# Patient Record
Sex: Female | Born: 1985 | Race: Black or African American | Hispanic: No | Marital: Single | Smoking: Current every day smoker
Health system: Southern US, Community
[De-identification: ages and names within clinical notes are randomized; demographics above are authoritative.]

## PROBLEM LIST (undated history)

## (undated) DIAGNOSIS — A599 Trichomoniasis, unspecified: Secondary | ICD-10-CM

## (undated) DIAGNOSIS — G8929 Other chronic pain: Secondary | ICD-10-CM

## (undated) DIAGNOSIS — M419 Scoliosis, unspecified: Secondary | ICD-10-CM

## (undated) DIAGNOSIS — R0602 Shortness of breath: Secondary | ICD-10-CM

## (undated) DIAGNOSIS — M5442 Lumbago with sciatica, left side: Secondary | ICD-10-CM

## (undated) DIAGNOSIS — M5440 Lumbago with sciatica, unspecified side: Secondary | ICD-10-CM

## (undated) DIAGNOSIS — J45909 Unspecified asthma, uncomplicated: Secondary | ICD-10-CM

## (undated) DIAGNOSIS — E079 Disorder of thyroid, unspecified: Secondary | ICD-10-CM

## (undated) DIAGNOSIS — M542 Cervicalgia: Secondary | ICD-10-CM

## (undated) DIAGNOSIS — M5136 Other intervertebral disc degeneration, lumbar region: Secondary | ICD-10-CM

## (undated) DIAGNOSIS — M549 Dorsalgia, unspecified: Secondary | ICD-10-CM

## (undated) DIAGNOSIS — M51369 Other intervertebral disc degeneration, lumbar region without mention of lumbar back pain or lower extremity pain: Secondary | ICD-10-CM

## (undated) DIAGNOSIS — N83209 Unspecified ovarian cyst, unspecified side: Secondary | ICD-10-CM

## (undated) DIAGNOSIS — A549 Gonococcal infection, unspecified: Secondary | ICD-10-CM

## (undated) DIAGNOSIS — Z789 Other specified health status: Secondary | ICD-10-CM

## (undated) HISTORY — DX: Lumbago with sciatica, unspecified side: M54.40

## (undated) HISTORY — DX: Scoliosis, unspecified: M41.9

## (undated) HISTORY — PX: NO PAST SURGERIES: SHX2092

## (undated) HISTORY — DX: Dorsalgia, unspecified: M54.9

## (undated) HISTORY — DX: Other intervertebral disc degeneration, lumbar region without mention of lumbar back pain or lower extremity pain: M51.369

## (undated) HISTORY — DX: Disorder of thyroid, unspecified: E07.9

## (undated) HISTORY — DX: Lumbago with sciatica, left side: M54.42

## (undated) HISTORY — DX: Other chronic pain: G89.29

## (undated) HISTORY — DX: Unspecified asthma, uncomplicated: J45.909

## (undated) HISTORY — DX: Cervicalgia: M54.2

## (undated) HISTORY — DX: Other intervertebral disc degeneration, lumbar region: M51.36

---
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
## 2007-07-07 ENCOUNTER — Emergency Department (HOSPITAL_COMMUNITY): Admission: EM | Admit: 2007-07-07 | Discharge: 2007-07-07 | Payer: Self-pay | Admitting: Emergency Medicine

## 2009-10-15 ENCOUNTER — Emergency Department (HOSPITAL_COMMUNITY): Admission: EM | Admit: 2009-10-15 | Discharge: 2009-10-16 | Payer: Self-pay | Admitting: Emergency Medicine

## 2009-10-15 ENCOUNTER — Ambulatory Visit: Payer: Self-pay | Admitting: Psychiatry

## 2009-10-16 ENCOUNTER — Inpatient Hospital Stay (HOSPITAL_COMMUNITY): Admission: EM | Admit: 2009-10-16 | Discharge: 2009-10-23 | Payer: Self-pay | Admitting: Psychiatry

## 2009-12-01 ENCOUNTER — Encounter: Payer: Self-pay | Admitting: Obstetrics & Gynecology

## 2009-12-01 ENCOUNTER — Ambulatory Visit: Payer: Self-pay | Admitting: Obstetrics & Gynecology

## 2009-12-01 LAB — CONVERTED CEMR LAB
Basophils Absolute: 0 10*3/uL (ref 0.0–0.1)
Basophils Relative: 0 % (ref 0–1)
Eosinophils Absolute: 0.1 10*3/uL (ref 0.0–0.7)
Eosinophils Relative: 2 % (ref 0–5)
HCT: 38 % (ref 36.0–46.0)
Hemoglobin: 12.5 g/dL (ref 12.0–15.0)
Hepatitis B Surface Ag: NEGATIVE
Hgb A2 Quant: 2.6 % (ref 2.2–3.2)
Hgb A: 97.4 % (ref 96.8–97.8)
MCHC: 32.9 g/dL (ref 30.0–36.0)
MCV: 90.3 fL (ref 78.0–100.0)
Neutrophils Relative %: 68 % (ref 43–77)
Platelets: 185 10*3/uL (ref 150–400)
RDW: 12.7 % (ref 11.5–15.5)
Rubella: 38.1 intl units/mL — ABNORMAL HIGH

## 2009-12-03 ENCOUNTER — Ambulatory Visit: Payer: Self-pay | Admitting: Obstetrics & Gynecology

## 2009-12-09 ENCOUNTER — Ambulatory Visit (HOSPITAL_COMMUNITY): Admission: RE | Admit: 2009-12-09 | Discharge: 2009-12-09 | Payer: Self-pay | Admitting: Obstetrics & Gynecology

## 2009-12-17 ENCOUNTER — Ambulatory Visit: Payer: Self-pay | Admitting: Obstetrics & Gynecology

## 2009-12-23 ENCOUNTER — Inpatient Hospital Stay (HOSPITAL_COMMUNITY): Admission: AD | Admit: 2009-12-23 | Discharge: 2009-12-23 | Payer: Self-pay | Admitting: Obstetrics & Gynecology

## 2009-12-31 ENCOUNTER — Ambulatory Visit (HOSPITAL_COMMUNITY): Admission: RE | Admit: 2009-12-31 | Discharge: 2009-12-31 | Payer: Self-pay | Admitting: Family Medicine

## 2009-12-31 ENCOUNTER — Encounter: Payer: Self-pay | Admitting: Family Medicine

## 2009-12-31 ENCOUNTER — Ambulatory Visit: Payer: Self-pay | Admitting: Obstetrics & Gynecology

## 2010-01-14 ENCOUNTER — Ambulatory Visit: Payer: Self-pay | Admitting: Obstetrics & Gynecology

## 2010-01-28 ENCOUNTER — Ambulatory Visit: Payer: Self-pay | Admitting: Obstetrics & Gynecology

## 2010-02-11 ENCOUNTER — Ambulatory Visit: Payer: Self-pay | Admitting: Obstetrics & Gynecology

## 2010-02-11 ENCOUNTER — Encounter: Payer: Self-pay | Admitting: Obstetrics & Gynecology

## 2010-02-11 LAB — CONVERTED CEMR LAB
Barbiturate Quant, Ur: NEGATIVE
Benzodiazepines.: NEGATIVE
Cocaine Metabolites: NEGATIVE
Creatinine,U: 95.9 mg/dL
Trich, Wet Prep: NONE SEEN

## 2010-02-18 ENCOUNTER — Ambulatory Visit: Payer: Self-pay | Admitting: Obstetrics & Gynecology

## 2010-03-11 ENCOUNTER — Encounter (INDEPENDENT_AMBULATORY_CARE_PROVIDER_SITE_OTHER): Payer: Self-pay | Admitting: *Deleted

## 2010-03-11 ENCOUNTER — Ambulatory Visit: Payer: Self-pay | Admitting: Obstetrics & Gynecology

## 2010-03-11 LAB — CONVERTED CEMR LAB
Hemoglobin: 11.8 g/dL — ABNORMAL LOW (ref 12.0–15.0)
MCV: 91.9 fL (ref 78.0–100.0)
RBC: 3.85 M/uL — ABNORMAL LOW (ref 3.87–5.11)

## 2010-03-12 ENCOUNTER — Ambulatory Visit (HOSPITAL_COMMUNITY): Admission: RE | Admit: 2010-03-12 | Discharge: 2010-03-12 | Payer: Self-pay | Admitting: Family Medicine

## 2010-03-18 ENCOUNTER — Ambulatory Visit (HOSPITAL_COMMUNITY): Admission: RE | Admit: 2010-03-18 | Discharge: 2010-03-18 | Payer: Self-pay | Admitting: Family Medicine

## 2010-03-18 ENCOUNTER — Encounter: Payer: Self-pay | Admitting: Obstetrics & Gynecology

## 2010-03-18 ENCOUNTER — Ambulatory Visit: Payer: Self-pay | Admitting: Family Medicine

## 2010-03-18 LAB — CONVERTED CEMR LAB
Amphetamine Screen, Ur: NEGATIVE
Barbiturate Quant, Ur: NEGATIVE
Benzodiazepines.: NEGATIVE
Cocaine Metabolites: NEGATIVE
Methadone: NEGATIVE
Phencyclidine (PCP): NEGATIVE

## 2010-03-25 ENCOUNTER — Ambulatory Visit: Payer: Self-pay | Admitting: Obstetrics & Gynecology

## 2010-03-25 ENCOUNTER — Ambulatory Visit (HOSPITAL_COMMUNITY): Admission: RE | Admit: 2010-03-25 | Discharge: 2010-03-25 | Payer: Self-pay | Admitting: Obstetrics & Gynecology

## 2010-04-06 ENCOUNTER — Ambulatory Visit: Payer: Self-pay | Admitting: Obstetrics and Gynecology

## 2010-05-10 ENCOUNTER — Ambulatory Visit: Payer: Self-pay | Admitting: Obstetrics & Gynecology

## 2010-05-10 ENCOUNTER — Encounter: Payer: Self-pay | Admitting: Family Medicine

## 2010-05-10 LAB — CONVERTED CEMR LAB
Benzodiazepines.: NEGATIVE
Creatinine,U: 84.9 mg/dL
Marijuana Metabolite: NEGATIVE
Methadone: NEGATIVE
Opiate Screen, Urine: NEGATIVE
Propoxyphene: NEGATIVE

## 2010-05-11 ENCOUNTER — Encounter: Payer: Self-pay | Admitting: Family Medicine

## 2010-05-11 LAB — CONVERTED CEMR LAB
Chlamydia, DNA Probe: NEGATIVE
GC Probe Amp, Genital: NEGATIVE

## 2010-05-13 ENCOUNTER — Ambulatory Visit: Payer: Self-pay | Admitting: Obstetrics & Gynecology

## 2010-05-20 ENCOUNTER — Ambulatory Visit: Payer: Self-pay | Admitting: Obstetrics and Gynecology

## 2010-05-20 ENCOUNTER — Ambulatory Visit (HOSPITAL_COMMUNITY): Admission: RE | Admit: 2010-05-20 | Discharge: 2010-05-20 | Payer: Self-pay | Admitting: Family Medicine

## 2010-05-24 ENCOUNTER — Ambulatory Visit: Payer: Self-pay | Admitting: Obstetrics & Gynecology

## 2010-05-25 ENCOUNTER — Ambulatory Visit: Payer: Self-pay | Admitting: Obstetrics and Gynecology

## 2010-05-25 ENCOUNTER — Inpatient Hospital Stay (HOSPITAL_COMMUNITY): Admission: AD | Admit: 2010-05-25 | Discharge: 2010-05-27 | Payer: Self-pay | Admitting: Family Medicine

## 2010-05-25 ENCOUNTER — Encounter: Payer: Self-pay | Admitting: Family Medicine

## 2010-10-24 ENCOUNTER — Encounter: Payer: Self-pay | Admitting: *Deleted

## 2010-12-17 LAB — RAPID URINE DRUG SCREEN, HOSP PERFORMED
Amphetamines: NOT DETECTED
Barbiturates: NOT DETECTED
Benzodiazepines: NOT DETECTED
Cocaine: POSITIVE — AB
Opiates: NOT DETECTED
Tetrahydrocannabinol: NOT DETECTED

## 2010-12-17 LAB — CBC
HCT: 29.6 % — ABNORMAL LOW (ref 36.0–46.0)
HCT: 35.8 % — ABNORMAL LOW (ref 36.0–46.0)
Hemoglobin: 10.4 g/dL — ABNORMAL LOW (ref 12.0–15.0)
Hemoglobin: 12.4 g/dL (ref 12.0–15.0)
MCH: 31.3 pg (ref 26.0–34.0)
MCH: 32 pg (ref 26.0–34.0)
MCHC: 34.5 g/dL (ref 30.0–36.0)
MCHC: 34.9 g/dL (ref 30.0–36.0)
MCV: 90.7 fL (ref 78.0–100.0)
MCV: 91.7 fL (ref 78.0–100.0)
Platelets: 126 10*3/uL — ABNORMAL LOW (ref 150–400)
Platelets: 139 10*3/uL — ABNORMAL LOW (ref 150–400)
RBC: 3.23 MIL/uL — ABNORMAL LOW (ref 3.87–5.11)
RBC: 3.95 MIL/uL (ref 3.87–5.11)
RDW: 14.2 % (ref 11.5–15.5)
RDW: 14.3 % (ref 11.5–15.5)
WBC: 7.3 10*3/uL (ref 4.0–10.5)
WBC: 8.8 10*3/uL (ref 4.0–10.5)

## 2010-12-17 LAB — POCT URINALYSIS DIPSTICK
Bilirubin Urine: NEGATIVE
Nitrite: NEGATIVE

## 2010-12-17 LAB — RPR: RPR Ser Ql: NONREACTIVE

## 2010-12-19 LAB — BASIC METABOLIC PANEL
BUN: 6 mg/dL (ref 6–23)
Calcium: 9.4 mg/dL (ref 8.4–10.5)
GFR calc non Af Amer: >60 mL/min (ref >60)
Glucose, Bld: 99 mg/dL (ref 70–99)

## 2010-12-19 LAB — CBC
Platelets: 206 10*3/uL (ref 150–400)
RDW: 12 % (ref 11.5–15.5)
WBC: 8.3 10*3/uL (ref 4.0–10.5)

## 2010-12-19 LAB — POCT URINALYSIS DIP (DEVICE)
Bilirubin Urine: NEGATIVE
Glucose, UA: NEGATIVE mg/dL
Ketones, ur: NEGATIVE mg/dL
Protein, ur: NEGATIVE mg/dL

## 2010-12-19 LAB — DIFFERENTIAL
Basophils Absolute: 0 10*3/uL (ref 0.0–0.1)
Lymphocytes Relative: 29 % (ref 12–46)
Neutro Abs: 4.9 10*3/uL (ref 1.7–7.7)
Neutrophils Relative %: 60 % (ref 43–77)

## 2010-12-19 LAB — ETHANOL: Alcohol, Ethyl (B): <5 mg/dL (ref 0–10)

## 2010-12-19 LAB — RAPID URINE DRUG SCREEN, HOSP PERFORMED
Amphetamines: NOT DETECTED
Barbiturates: NOT DETECTED
Benzodiazepines: NOT DETECTED
Cocaine: POSITIVE — AB
Tetrahydrocannabinol: NOT DETECTED

## 2010-12-19 LAB — HCG, QUANTITATIVE, PREGNANCY: hCG, Beta Chain, Quant, S: 87808 m[IU]/mL — ABNORMAL HIGH (ref <5)

## 2010-12-19 LAB — PREGNANCY, URINE: Preg Test, Ur: POSITIVE

## 2010-12-20 LAB — POCT URINALYSIS DIP (DEVICE)
Bilirubin Urine: NEGATIVE
Ketones, ur: NEGATIVE mg/dL
pH: 6.5 (ref 5.0–8.0)

## 2010-12-20 LAB — HEPATIC FUNCTION PANEL
ALT: 19 U/L (ref 0–35)
AST: 16 U/L (ref 0–37)
Albumin: 3.2 g/dL — ABNORMAL LOW (ref 3.5–5.2)
Total Protein: 5.9 g/dL — ABNORMAL LOW (ref 6.0–8.3)

## 2010-12-21 LAB — POCT URINALYSIS DIP (DEVICE)
Bilirubin Urine: NEGATIVE
Glucose, UA: NEGATIVE mg/dL
Hgb urine dipstick: NEGATIVE
Ketones, ur: NEGATIVE mg/dL
Protein, ur: NEGATIVE mg/dL
Specific Gravity, Urine: 1.02 (ref 1.005–1.030)
Specific Gravity, Urine: 1.02 (ref 1.005–1.030)
Urobilinogen, UA: 0.2 mg/dL (ref 0.0–1.0)

## 2010-12-22 LAB — POCT URINALYSIS DIP (DEVICE)
Bilirubin Urine: NEGATIVE
Hgb urine dipstick: NEGATIVE
Ketones, ur: NEGATIVE mg/dL
Specific Gravity, Urine: 1.02 (ref 1.005–1.030)
pH: 7.5 (ref 5.0–8.0)

## 2010-12-26 LAB — URINALYSIS, ROUTINE W REFLEX MICROSCOPIC
Bilirubin Urine: NEGATIVE
Glucose, UA: NEGATIVE mg/dL
Hgb urine dipstick: NEGATIVE
Ketones, ur: NEGATIVE mg/dL
Nitrite: NEGATIVE
Protein, ur: NEGATIVE mg/dL
Specific Gravity, Urine: 1.01 (ref 1.005–1.030)
Urobilinogen, UA: 0.2 mg/dL (ref 0.0–1.0)
pH: 6.5 (ref 5.0–8.0)

## 2010-12-26 LAB — POCT URINALYSIS DIP (DEVICE)
Bilirubin Urine: NEGATIVE
Bilirubin Urine: NEGATIVE
Bilirubin Urine: NEGATIVE
Glucose, UA: NEGATIVE mg/dL
Glucose, UA: NEGATIVE mg/dL
Glucose, UA: NEGATIVE mg/dL
Hgb urine dipstick: NEGATIVE
Hgb urine dipstick: NEGATIVE
Hgb urine dipstick: NEGATIVE
Ketones, ur: NEGATIVE mg/dL
Ketones, ur: NEGATIVE mg/dL
Ketones, ur: NEGATIVE mg/dL
Nitrite: NEGATIVE
Nitrite: NEGATIVE
Nitrite: NEGATIVE
Protein, ur: NEGATIVE mg/dL
Protein, ur: NEGATIVE mg/dL
Protein, ur: NEGATIVE mg/dL
Specific Gravity, Urine: 1.015 (ref 1.005–1.030)
Specific Gravity, Urine: 1.02 (ref 1.005–1.030)
Specific Gravity, Urine: 1.02 (ref 1.005–1.030)
Urobilinogen, UA: 0.2 mg/dL (ref 0.0–1.0)
Urobilinogen, UA: 0.2 mg/dL (ref 0.0–1.0)
Urobilinogen, UA: 0.2 mg/dL (ref 0.0–1.0)
pH: 6 (ref 5.0–8.0)
pH: 7 (ref 5.0–8.0)
pH: 7 (ref 5.0–8.0)

## 2011-02-10 ENCOUNTER — Emergency Department (HOSPITAL_COMMUNITY): Payer: Self-pay

## 2011-02-10 ENCOUNTER — Emergency Department (HOSPITAL_COMMUNITY)
Admission: EM | Admit: 2011-02-10 | Discharge: 2011-02-10 | Disposition: A | Discharge status: Home / Self Care | Payer: Self-pay | Attending: Emergency Medicine | Admitting: Emergency Medicine

## 2011-02-10 ENCOUNTER — Encounter (HOSPITAL_COMMUNITY): Payer: Self-pay | Admitting: Radiology

## 2011-02-10 DIAGNOSIS — R630 Anorexia: Secondary | ICD-10-CM | POA: Insufficient documentation

## 2011-02-10 DIAGNOSIS — R110 Nausea: Secondary | ICD-10-CM | POA: Insufficient documentation

## 2011-02-10 DIAGNOSIS — R10819 Abdominal tenderness, unspecified site: Secondary | ICD-10-CM | POA: Insufficient documentation

## 2011-02-10 DIAGNOSIS — R109 Unspecified abdominal pain: Secondary | ICD-10-CM | POA: Insufficient documentation

## 2011-02-10 LAB — CBC
MCHC: 33.9 g/dL (ref 30.0–36.0)
Platelets: 219 10*3/uL (ref 150–400)
RDW: 13.1 % (ref 11.5–15.5)
WBC: 6.6 10*3/uL (ref 4.0–10.5)

## 2011-02-10 LAB — URINALYSIS, ROUTINE W REFLEX MICROSCOPIC
Glucose, UA: NEGATIVE mg/dL
Ketones, ur: 15 mg/dL — AB
Specific Gravity, Urine: 1.02 (ref 1.005–1.030)
pH: 5.5 (ref 5.0–8.0)

## 2011-02-10 LAB — DIFFERENTIAL
Basophils Absolute: 0 10*3/uL (ref 0.0–0.1)
Basophils Relative: 0 % (ref 0–1)
Eosinophils Absolute: 0.3 10*3/uL (ref 0.0–0.7)
Eosinophils Relative: 5 % (ref 0–5)
Lymphocytes Relative: 26 % (ref 12–46)
Monocytes Absolute: 0.4 10*3/uL (ref 0.1–1.0)

## 2011-02-10 LAB — WET PREP, GENITAL
Trich, Wet Prep: NONE SEEN
Yeast Wet Prep HPF POC: NONE SEEN

## 2011-02-10 LAB — HEPATIC FUNCTION PANEL
ALT: 10 U/L (ref 0–35)
AST: 10 U/L (ref 0–37)
Bilirubin, Direct: <0.1 mg/dL (ref 0.0–0.3)
Total Protein: 7.1 g/dL (ref 6.0–8.3)

## 2011-02-10 LAB — BASIC METABOLIC PANEL
Calcium: 8.9 mg/dL (ref 8.4–10.5)
GFR calc non Af Amer: >60 mL/min (ref >60)
Potassium: 4 mEq/L (ref 3.5–5.1)
Sodium: 137 mEq/L (ref 135–145)

## 2011-02-10 MED ORDER — IOHEXOL 300 MG/ML  SOLN
100.0000 mL | Freq: Once | INTRAMUSCULAR | Status: AC | PRN
  Administered 2011-02-10: 100 mL via INTRAVENOUS

## 2011-02-11 LAB — GC/CHLAMYDIA PROBE AMP, GENITAL: Chlamydia, DNA Probe: NEGATIVE

## 2011-05-18 ENCOUNTER — Other Ambulatory Visit (HOSPITAL_COMMUNITY)
Admission: RE | Admit: 2011-05-18 | Discharge: 2011-05-18 | Disposition: A | Discharge status: Home / Self Care | Payer: Medicaid Other | Source: Ambulatory Visit | Attending: Obstetrics & Gynecology | Admitting: Obstetrics & Gynecology

## 2011-05-18 ENCOUNTER — Ambulatory Visit (HOSPITAL_COMMUNITY): Payer: Self-pay | Admitting: Specialist

## 2011-05-18 ENCOUNTER — Other Ambulatory Visit: Payer: Self-pay | Admitting: Obstetrics & Gynecology

## 2011-05-18 DIAGNOSIS — Z113 Encounter for screening for infections with a predominantly sexual mode of transmission: Secondary | ICD-10-CM | POA: Insufficient documentation

## 2011-05-18 DIAGNOSIS — Z01419 Encounter for gynecological examination (general) (routine) without abnormal findings: Secondary | ICD-10-CM | POA: Insufficient documentation

## 2011-08-11 ENCOUNTER — Encounter: Payer: Self-pay | Admitting: Obstetrics and Gynecology

## 2011-08-22 ENCOUNTER — Inpatient Hospital Stay (HOSPITAL_COMMUNITY): Payer: Medicaid Other

## 2011-08-22 ENCOUNTER — Encounter (HOSPITAL_COMMUNITY): Payer: Self-pay

## 2011-08-22 ENCOUNTER — Inpatient Hospital Stay (HOSPITAL_COMMUNITY)
Admission: AD | Admit: 2011-08-22 | Discharge: 2011-08-22 | Disposition: A | Discharge status: Home / Self Care | Payer: Medicaid Other | Source: Ambulatory Visit | Attending: Obstetrics & Gynecology | Admitting: Obstetrics & Gynecology

## 2011-08-22 DIAGNOSIS — R109 Unspecified abdominal pain: Secondary | ICD-10-CM | POA: Insufficient documentation

## 2011-08-22 DIAGNOSIS — O4690 Antepartum hemorrhage, unspecified, unspecified trimester: Secondary | ICD-10-CM | POA: Insufficient documentation

## 2011-08-22 DIAGNOSIS — O4693 Antepartum hemorrhage, unspecified, third trimester: Secondary | ICD-10-CM

## 2011-08-22 HISTORY — DX: Gonococcal infection, unspecified: A54.9

## 2011-08-22 HISTORY — DX: Trichomoniasis, unspecified: A59.9

## 2011-08-22 HISTORY — DX: Unspecified ovarian cyst, unspecified side: N83.209

## 2011-08-22 LAB — WET PREP, GENITAL
Clue Cells Wet Prep HPF POC: NONE SEEN
Trich, Wet Prep: NONE SEEN
Yeast Wet Prep HPF POC: NONE SEEN

## 2011-08-22 LAB — URINALYSIS, ROUTINE W REFLEX MICROSCOPIC
Glucose, UA: NEGATIVE mg/dL
Ketones, ur: NEGATIVE mg/dL
Nitrite: NEGATIVE
Protein, ur: NEGATIVE mg/dL
pH: 7 (ref 5.0–8.0)

## 2011-08-22 LAB — URINE MICROSCOPIC-ADD ON

## 2011-08-22 NOTE — ED Notes (Signed)
V. Smith CNM in to see pt 

## 2011-08-22 NOTE — Progress Notes (Signed)
Patient states that after having intercourse earlier today she had some bleeding, none now. Started having lower abdominal pain and rectal pressure. Has a history of a stillbirth at about this gestation and is very concerned. Reports good fetal movement and no leaking of fluid.

## 2011-08-22 NOTE — Progress Notes (Signed)
Dr. Remigio Eisenmenger notified of pt return from [facility].

## 2011-08-22 NOTE — ED Provider Notes (Addendum)
History     Chief Complaint  Patient presents with  . Abdominal Pain   HPI Rebecca Delacruz 25 y.o. female  [id number] at [redacted]w[redacted]d presenting with postcoital bleeding. Patient with a history of IUFD during previous pregnancy related to cocaine use. Presents after sex today with small amount of blood after intercourse (states wouldn't need a pad for this amount). Believes this is similar to previous time when she lost her child though so she is very concerned.    OB History    Grav Para Term Preterm Abortions TAB SAB Ect Mult Living   5 3 2 1 1     2      Obstetric Comments   Pt reported Abruption as part of del of FDIU, in 2010, but records deny any mention of abruption.  Pt had known FDIU prior to presentation in active labor, delivering promptly upon arrival  Urine Drug screen neg at time of del.      Past Medical History  Diagnosis Date  . Ovarian cyst   . Gonorrhea   . Trichomonas   . Constipation     History reviewed. No pertinent past surgical history.  Family History  Problem Relation Age of Onset  . Diabetes Mother     History  Substance Use Topics  . Smoking status: Current Everyday Smoker -- 0.3 packs/day    Types: Cigarettes  . Smokeless tobacco: Not on file  . Alcohol Use: No    Allergies: No Known Allergies  Prescriptions prior to admission  Medication Sig Dispense Refill  . Multiple Vitamin (MULTIVITAMIN) tablet Take 1 tablet by mouth daily.          ROS Physical Exam   Blood pressure 134/63, pulse 81, temperature 98.7 F (37.1 C), temperature source Oral, resp. rate 20, height 5\' 3"  (1.6 m), weight 77.928 kg (171 lb 12.8 oz), SpO2 97.00%, unknown if currently breastfeeding.  Physical Exam  Constitutional: She is oriented to person, place, and time. She appears well-developed and well-nourished. No distress.  Cardiovascular: Normal rate and regular rhythm.  Exam reveals no gallop and no friction rub.   No murmur heard. Respiratory: No respiratory  distress. She has no wheezes. She has no rales.  GI:       Gravid. Size consistent with dates.   Genitourinary: Vagina normal. No vaginal discharge found.  Neurological: She is alert and oriented to person, place, and time.   Dilation: Closed Effacement (%): Thick Exam by:: Dr. Lorayne Bender. Ardelia Mems RN   MAU Course  Procedures  MDM Pelvic exam without gross blood. GC/Chlamydia/wet prep/ultrasound/UDS.   Assessment and Plan  #1 [name] 25 y.o. female  [id number] at [redacted]w[redacted]d  #2 Post coital bleeding  Decision making pending above findings. Night team to assume care and follow.   Case Discussed with Wynelle Bourgeois, CNM.   Tana Conch 08/22/2011, 8:19 PM   Ultrasound was obtained to evaluate for possible abruption and no evidence of this was seen.  She was also monitored for an extended period of time and no fetal distress was noted. Therefore there is little evidence of a possible abruption at this time.  Precautions were given to the patient to return to the MAU if she has recurrence of significant bleeding or increased abdominal pain.  Large Hg was seen on her urine dipstick, therefore urine was sent for culture and this will be followed-up in the outpatient setting.  UDS is pending at the time of discharge.

## 2011-08-23 LAB — URINE CULTURE: Culture: NO GROWTH

## 2011-08-23 LAB — GC/CHLAMYDIA PROBE AMP, GENITAL: Chlamydia, DNA Probe: NEGATIVE

## 2011-08-25 IMAGING — CT CT ABD-PELV W/ CM
2 of 4 series · 17 of 46 positions shown, 19 images · IV contrast (APPLIED)
Comparison: None.

CLINICAL DATA: Right-sided abdominal pain for 2 weeks.

CT ABDOMEN AND PELVIS WITH CONTRAST
TECHNIQUE: Multidetector CT imaging of the abdomen and pelvis was
performed following the standard protocol during bolus
administration of intravenous contrast.
Contrast: 100 ml Pmnipaque-RYY intravenously.

[Series 2: abd/pelv with 5.0 b31f st · axial · 0.67mm/px · z∈[+564,+934]mm · 14 of 82 slices shown, 16 images]
[im 4/82  soft-tissue]
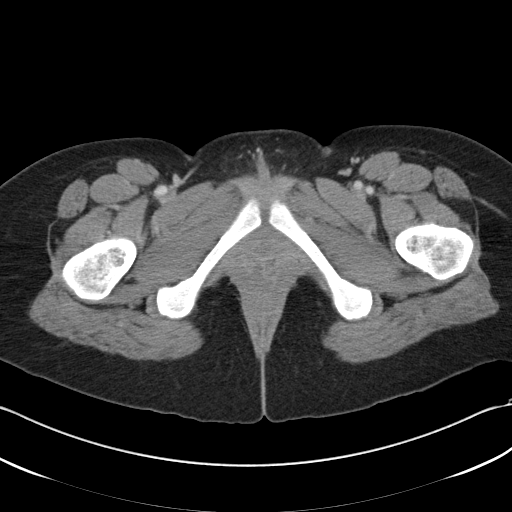
[im 4/82  bone]
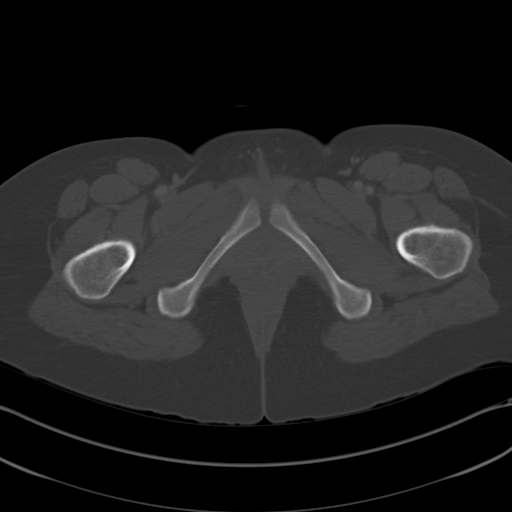
[im 11/82  soft-tissue]
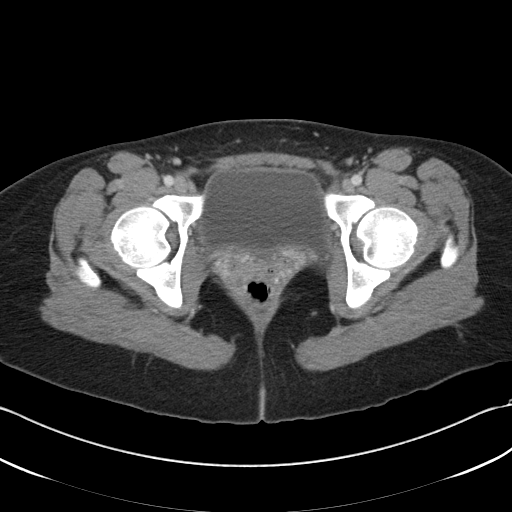
[im 17/82  soft-tissue]
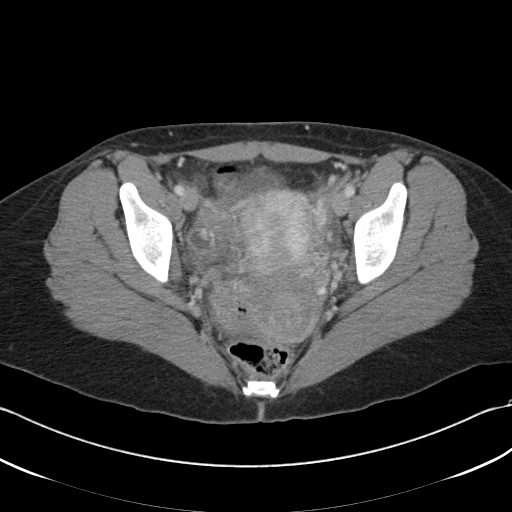
[im 21/82  soft-tissue]
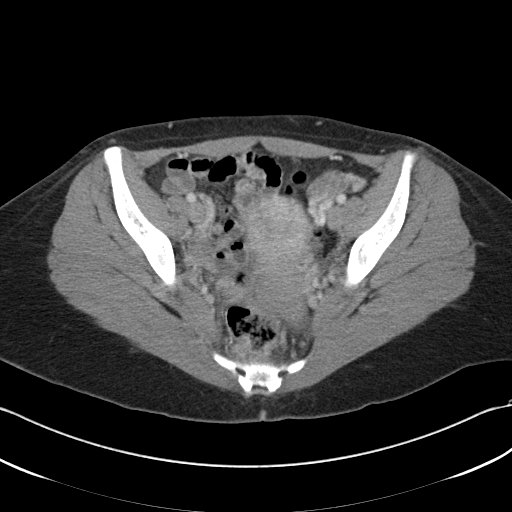
[im 28/82  soft-tissue]
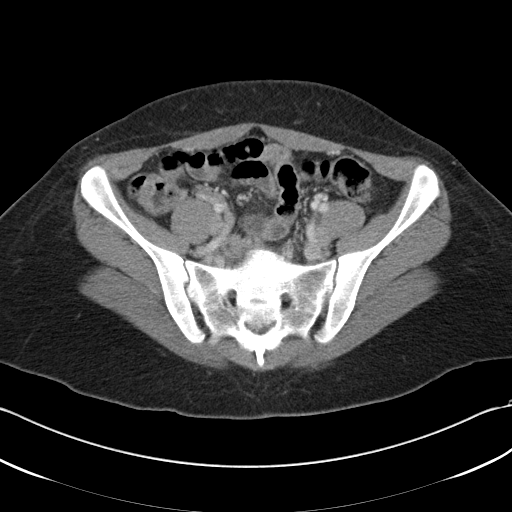
[im 34/82  soft-tissue]
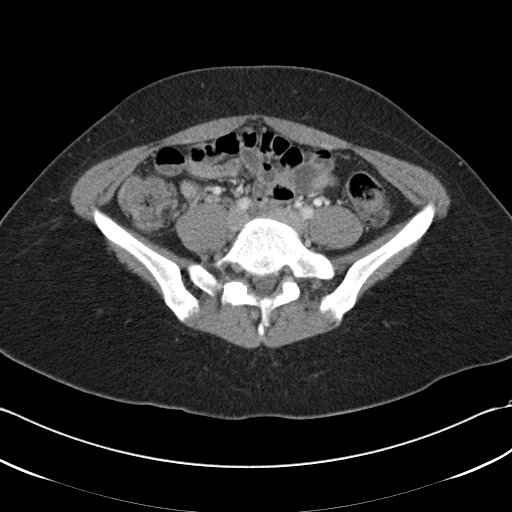
[im 38/82  soft-tissue]
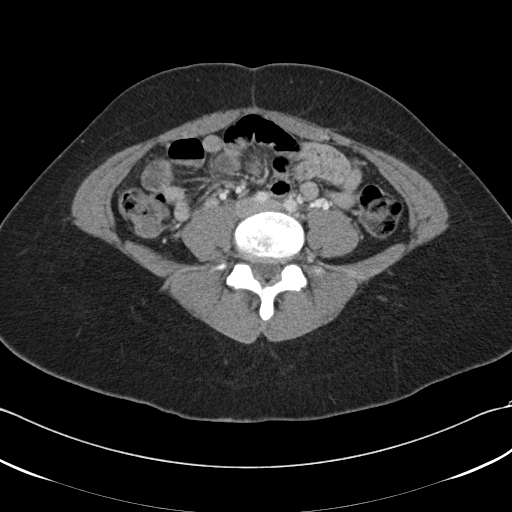
[im 44/82  soft-tissue]
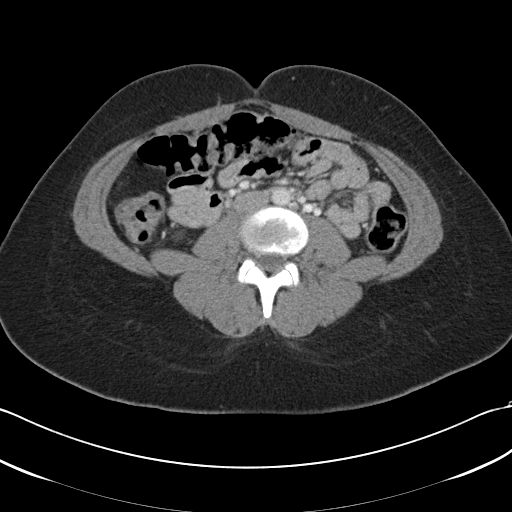
[im 48/82  soft-tissue]
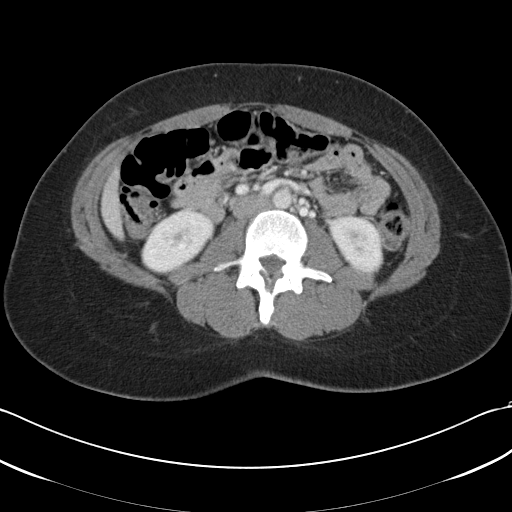
[im 48/82  bone]
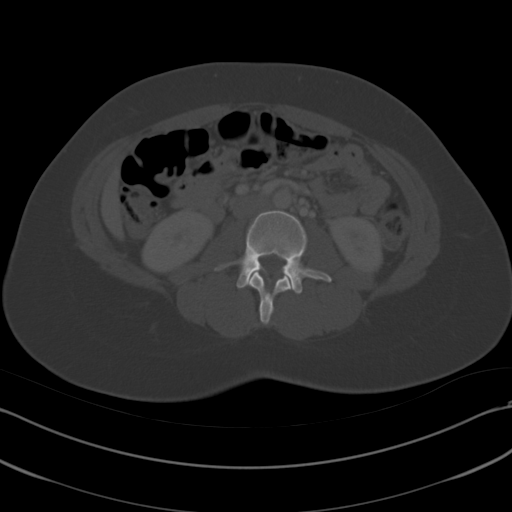
[im 55/82  soft-tissue]
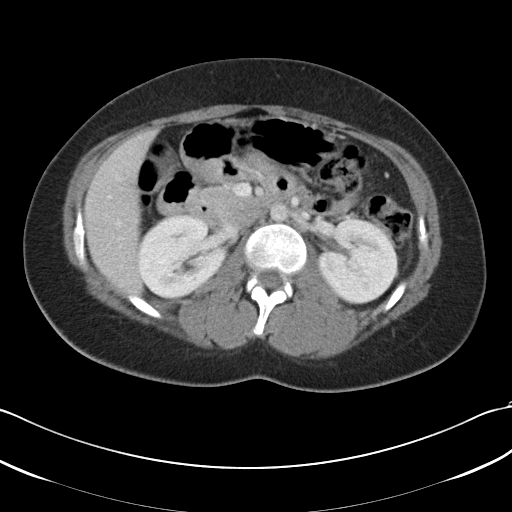
[im 61/82  soft-tissue]
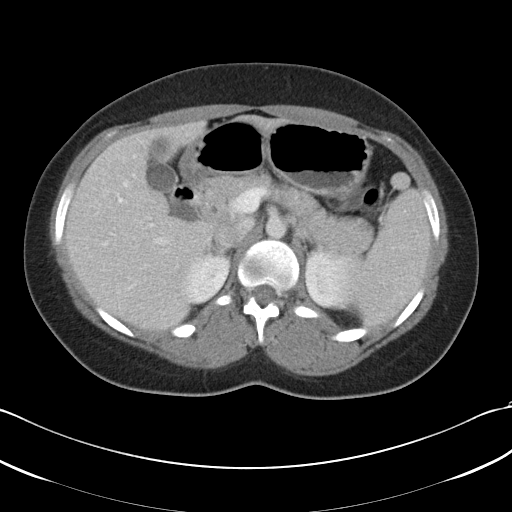
[im 65/82  soft-tissue]
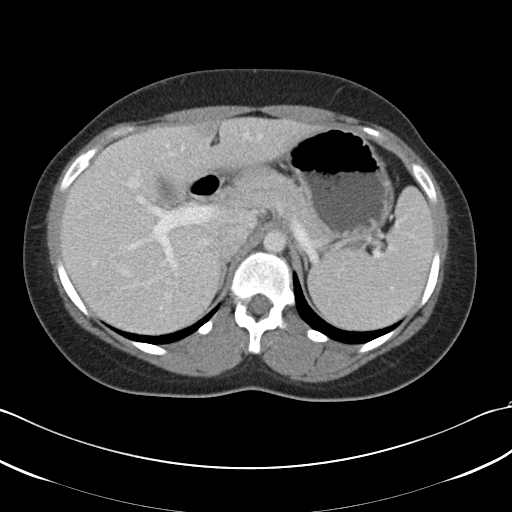
[im 71/82  soft-tissue]
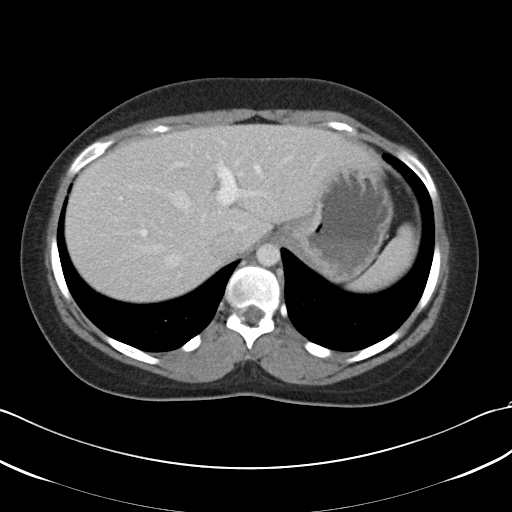
[im 78/82  soft-tissue]
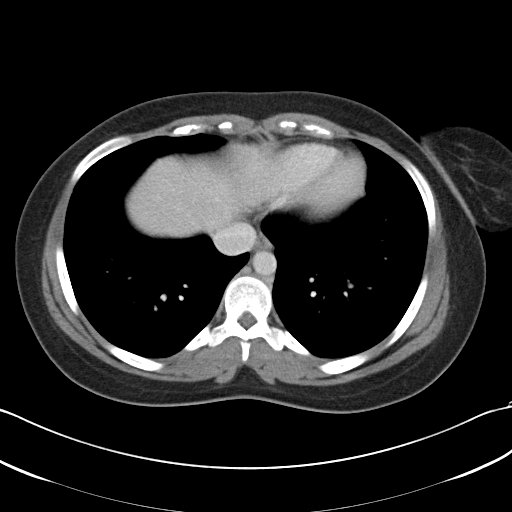

[Series 602: <mpr range> · coronal · 0.80mm/px · 3 of 97 slices shown]
[im 33/97  soft-tissue]
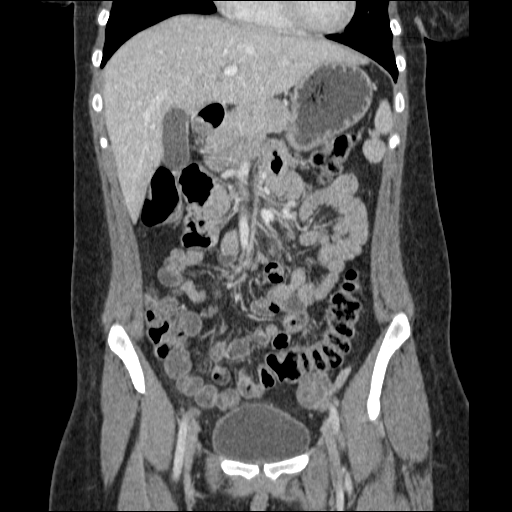
[im 43/97  soft-tissue]
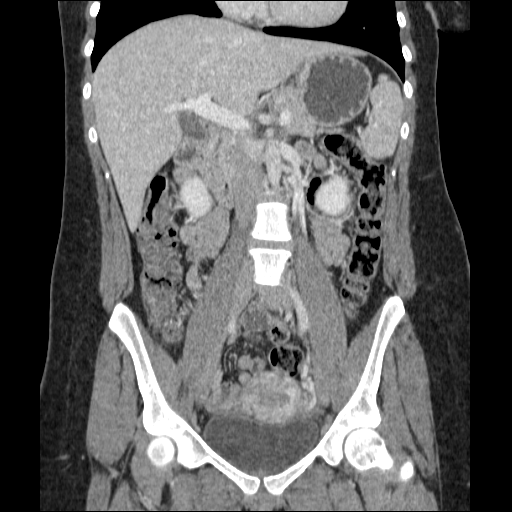
[im 54/97  soft-tissue]
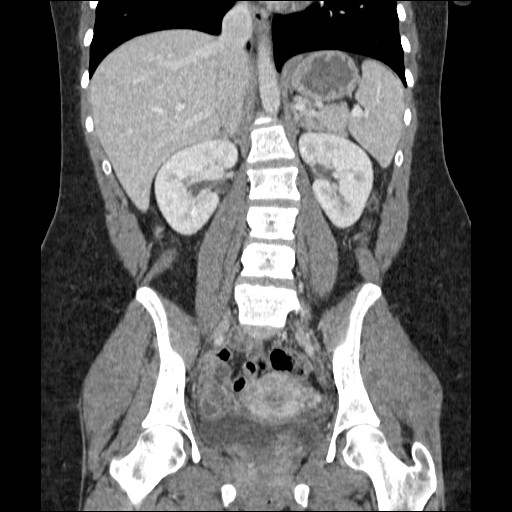

[17 of 46 positions shown; findings below may reference images not displayed]

FINDINGS: The lung bases are clear.  There is no pleural effusion.

The liver, spleen, gallbladder and pancreas appear normal.  There
is no adrenal mass.  There is a tiny low density lesion in the mid
right kidney which is likely a cyst.  The left kidney appears
normal.  There is no hydronephrosis or perinephric soft tissue
stranding.

Enteric contrast was not administered.  There is no evidence of
bowel wall thickening or surrounding inflammation.  No extraluminal
fluid collections are identified.  The appendix appears normal,
best seen on the coronal images.

The ovarian veins are minimally prominent bilaterally.  There is no
suspicious adnexal finding.  There is a small collapsing follicle
within the right ovary, measuring 1.7 cm on image 66.  The left
ovary and uterus appear normal.  The urinary bladder appears
normal.
IMPRESSION: 1.  Small collapsing right ovarian follicle without surrounding
inflammation.
2.  No evidence of appendicitis, other bowel wall inflammation or
abscess.
3.  No suspicious visceral organ findings.

## 2011-09-02 ENCOUNTER — Encounter (HOSPITAL_COMMUNITY): Payer: Self-pay | Admitting: *Deleted

## 2011-09-02 ENCOUNTER — Observation Stay (HOSPITAL_COMMUNITY)
Admission: AD | Admit: 2011-09-02 | Discharge: 2011-09-02 | Disposition: A | Discharge status: Home / Self Care | Payer: Medicaid Other | Source: Ambulatory Visit | Attending: Family Medicine | Admitting: Family Medicine

## 2011-09-02 DIAGNOSIS — J209 Acute bronchitis, unspecified: Secondary | ICD-10-CM

## 2011-09-02 DIAGNOSIS — Z331 Pregnant state, incidental: Secondary | ICD-10-CM

## 2011-09-02 DIAGNOSIS — O99891 Other specified diseases and conditions complicating pregnancy: Principal | ICD-10-CM | POA: Insufficient documentation

## 2011-09-02 DIAGNOSIS — J45909 Unspecified asthma, uncomplicated: Secondary | ICD-10-CM | POA: Insufficient documentation

## 2011-09-02 DIAGNOSIS — J069 Acute upper respiratory infection, unspecified: Secondary | ICD-10-CM | POA: Insufficient documentation

## 2011-09-02 HISTORY — DX: Other specified health status: Z78.9

## 2011-09-02 HISTORY — DX: Shortness of breath: R06.02

## 2011-09-02 MED ORDER — ALBUTEROL SULFATE HFA 108 (90 BASE) MCG/ACT IN AERS: 2.0000 | INHALATION_SPRAY | RESPIRATORY_TRACT | Status: DC | PRN

## 2011-09-02 MED ORDER — ALBUTEROL SULFATE (5 MG/ML) 0.5% IN NEBU
2.5000 mg | INHALATION_SOLUTION | Freq: Once | RESPIRATORY_TRACT | Status: AC
  Administered 2011-09-02: 2.5 mg via RESPIRATORY_TRACT
  Filled 2011-09-02: qty 0.5

## 2011-09-02 MED ORDER — IPRATROPIUM BROMIDE 0.02 % IN SOLN
0.5000 mg | Freq: Once | RESPIRATORY_TRACT | Status: AC
  Administered 2011-09-02: 0.5 mg via RESPIRATORY_TRACT
  Filled 2011-09-02: qty 2.5

## 2011-09-02 NOTE — Discharge Instructions (Signed)
Asthma Attack Prevention HOW CAN ASTHMA BE PREVENTED? Currently, there is no way to prevent asthma from starting. However, you can take steps to control the disease and prevent its symptoms after you have been diagnosed. Learn about your asthma and how to control it. Take an active role to control your asthma by working with your caregiver to create and follow an asthma action plan. An asthma action plan guides you in taking your medicines properly, avoiding factors that make your asthma worse, tracking your level of asthma control, responding to worsening asthma, and seeking emergency care when needed. To track your asthma, keep records of your symptoms, check your peak flow number using a peak flow meter (handheld device that shows how well air moves out of your lungs), and get regular asthma checkups.  Other ways to prevent asthma attacks include:  Use medicines as your caregiver directs.   Identify and avoid things that make your asthma worse (as much as you can).   Keep track of your asthma symptoms and level of control.   Get regular checkups for your asthma.   With your caregiver, write a detailed plan for taking medicines and managing an asthma attack. Then be sure to follow your action plan. Asthma is an ongoing condition that needs regular monitoring and treatment.   Identify and avoid asthma triggers. A number of outdoor allergens and irritants (pollen, mold, cold air, air pollution) can trigger asthma attacks. Find out what causes or makes your asthma worse, and take steps to avoid those triggers (see below).   Monitor your breathing. Learn to recognize warning signs of an attack, such as slight coughing, wheezing or shortness of breath. However, your lung function may already decrease before you notice any signs or symptoms, so regularly measure and record your peak airflow with a home peak flow meter.   Identify and treat attacks early. If you act quickly, you're less likely to have  a severe attack. You will also need less medicine to control your symptoms. When your peak flow measurements decrease and alert you to an upcoming attack, take your medicine as instructed, and immediately stop any activity that may have triggered the attack. If your symptoms do not improve, get medical help.   Pay attention to increasing quick-relief inhaler use. If you find yourself relying on your quick-relief inhaler (such as albuterol), your asthma is not under control. See your caregiver about adjusting your treatment.  IDENTIFY AND CONTROL FACTORS THAT MAKE YOUR ASTHMA WORSE A number of common things can set off or make your asthma symptoms worse (asthma triggers). Keep track of your asthma symptoms for several weeks, detailing all the environmental and emotional factors that are linked with your asthma. When you have an asthma attack, go back to your asthma diary to see which factor, or combination of factors, might have contributed to it. Once you know what these factors are, you can take steps to control many of them.  Allergies: If you have allergies and asthma, it is important to take asthma prevention steps at home. Asthma attacks (worsening of asthma symptoms) can be triggered by allergies, which can cause temporary increased inflammation of your airways. Minimizing contact with the substance to which you are allergic will help prevent an asthma attack. Animal Dander:   Some people are allergic to the flakes of skin or dried saliva from animals with fur or feathers. Keep these pets out of your home.   If you can't keep a pet outdoors, keep the  pet out of your bedroom and other sleeping areas at all times, and keep the door closed.   Remove carpets and furniture covered with cloth from your home. If that is not possible, keep the pet away from fabric-covered furniture and carpets.  Dust Mites:  Many people with asthma are allergic to dust mites. Dust mites are tiny bugs that are found in  every home, in mattresses, pillows, carpets, fabric-covered furniture, bedcovers, clothes, stuffed toys, fabric, and other fabric-covered items.   Cover your mattress in a special dust-proof cover.   Cover your pillow in a special dust-proof cover, or wash the pillow each week in hot water. Water must be hotter than 130 F to kill dust mites. Cold or warm water used with detergent and bleach can also be effective.   Wash the sheets and blankets on your bed each week in hot water.   Try not to sleep or lie on cloth-covered cushions.   Call ahead when traveling and ask for a smoke-free hotel room. Bring your own bedding and pillows, in case the hotel only supplies feather pillows and down comforters, which may contain dust mites and cause asthma symptoms.   Remove carpets from your bedroom and those laid on concrete, if you can.   Keep stuffed toys out of the bed, or wash the toys weekly in hot water or cooler water with detergent and bleach.  Cockroaches:  Many people with asthma are allergic to the droppings and remains of cockroaches.   Keep food and garbage in closed containers. Never leave food out.   Use poison baits, traps, powders, gels, or paste (for example, boric acid).   If a spray is used to kill cockroaches, stay out of the room until the odor goes away.  Indoor Mold:  Fix leaky faucets, pipes, or other sources of water that have mold around them.   Clean moldy surfaces with a cleaner that has bleach in it.  Pollen and Outdoor Mold:  When pollen or mold spore counts are high, try to keep your windows closed.   Stay indoors with windows closed from late morning to afternoon, if you can. Pollen and some mold spore counts are highest at that time.   Ask your caregiver whether you need to take or increase anti-inflammatory medicine before your allergy season starts.  Irritants:   Tobacco smoke is an irritant. If you smoke, ask your caregiver how you can quit. Ask family  members to quit smoking, too. Do not allow smoking in your home or car.   If possible, do not use a wood-burning stove, kerosene heater, or fireplace. Minimize exposure to all sources of smoke, including incense, candles, fires, and fireworks.   Try to stay away from strong odors and sprays, such as perfume, talcum powder, hair spray, and paints.   Decrease humidity in your home and use an indoor air cleaning device. Reduce indoor humidity to below 60 percent. Dehumidifiers or central air conditioners can do this.   Try to have someone else vacuum for you once or twice a week, if you can. Stay out of rooms while they are being vacuumed and for a short while afterward.   If you vacuum, use a dust mask from a hardware store, a double-layered or microfilter vacuum cleaner bag, or a vacuum cleaner with a HEPA filter.   Sulfites in foods and beverages can be irritants. Do not drink beer or wine, or eat dried fruit, processed potatoes, or shrimp if they cause asthma  symptoms.   Cold air can trigger an asthma attack. Cover your nose and mouth with a scarf on cold or windy days.   Several health conditions can make asthma more difficult to manage, including runny nose, sinus infections, reflux disease, psychological stress, and sleep apnea. Your caregiver will treat these conditions, as well.   Avoid close contact with people who have a cold or the flu, since your asthma symptoms may get worse if you catch the infection from them. Wash your hands thoroughly after touching items that may have been handled by people with a respiratory infection.   Get a flu shot every year to protect against the flu virus, which often makes asthma worse for days or weeks. Also get a pneumonia shot once every five to 10 years.  Drugs:  Aspirin and other painkillers can cause asthma attacks. 10% to 20% of people with asthma have sensitivity to aspirin or a group of painkillers called non-steroidal anti-inflammatory drugs  (NSAIDS), such as ibuprofen and naproxen. These drugs are used to treat pain and reduce fevers. Asthma attacks caused by any of these medicines can be severe and even fatal. These drugs must be avoided in people who have known aspirin sensitive asthma. Products with acetaminophen are considered safe for people who have asthma. It is important that people with aspirin sensitivity read labels of all over-the-counter drugs used to treat pain, colds, coughs, and fever.   Beta blockers and ACE inhibitors are other drugs which you should discuss with your caregiver, in relation to your asthma.  ALLERGY SKIN TESTING  Ask your asthma caregiver about allergy skin testing or blood testing (RAST test) to identify the allergens to which you are sensitive. If you are found to have allergies, allergy shots (immunotherapy) for asthma may help prevent future allergies and asthma. With allergy shots, small doses of allergens (substances to which you are allergic) are injected under your skin on a regular schedule. Over a period of time, your body may become used to the allergen and less responsive with asthma symptoms. You can also take measures to minimize your exposure to those allergens. EXERCISE  If you have exercise-induced asthma, or are planning vigorous exercise, or exercise in cold, humid, or dry environments, prevent exercise-induced asthma by following your caregiver's advice regarding asthma treatment before exercising. Document Released: 09/07/2009 Document Revised: 06/01/2011 Document Reviewed: 09/07/2009 [facility] Patient Information 2012 [place], [place].Bronchitis Bronchitis is the body's way of reacting to injury and/or infection (inflammation) of the bronchi. Bronchi are the air tubes that extend from the windpipe into the lungs. If the inflammation becomes severe, it may cause shortness of breath. CAUSES  Inflammation may be caused by:  A virus.   Germs (bacteria).   Dust.   Allergens.    Pollutants and many other irritants.  The cells lining the bronchial tree are covered with tiny hairs (cilia). These constantly beat upward, away from the lungs, toward the mouth. This keeps the lungs free of pollutants. When these cells become too irritated and are unable to do their job, mucus begins to develop. This causes the characteristic cough of bronchitis. The cough clears the lungs when the cilia are unable to do their job. Without either of these protective mechanisms, the mucus would settle in the lungs. Then you would develop pneumonia. Smoking is a common cause of bronchitis and can contribute to pneumonia. Stopping this habit is the single most important thing you can do to help yourself. TREATMENT   Your caregiver may prescribe  an antibiotic if the cough is caused by bacteria. Also, medicines that open up your airways make it easier to breathe. Your caregiver may also recommend or prescribe an expectorant. It will loosen the mucus to be coughed up. Only take over-the-counter or prescription medicines for pain, discomfort, or fever as directed by your caregiver.   Removing whatever causes the problem (smoking, for example) is critical to preventing the problem from getting worse.   Cough suppressants may be prescribed for relief of cough symptoms.   Inhaled medicines may be prescribed to help with symptoms now and to help prevent problems from returning.   For those with recurrent (chronic) bronchitis, there may be a need for steroid medicines.  SEEK IMMEDIATE MEDICAL CARE IF:   During treatment, you develop more pus-like mucus (purulent sputum).   You have a fever.   Your baby is older than 3 months with a rectal temperature of 102 F (38.9 C) or higher.   Your baby is 29 months old or younger with a rectal temperature of 100.4 F (38 C) or higher.   You become progressively more ill.   You have increased difficulty breathing, wheezing, or shortness of breath.  It is  necessary to seek immediate medical care if you are elderly or sick from any other disease. MAKE SURE YOU:   Understand these instructions.   Will watch your condition.   Will get help right away if you are not doing well or get worse.  Document Released: 09/19/2005 Document Revised: 06/01/2011 Document Reviewed: 07/29/2008 [facility] Patient Information 2012 [place], [place].

## 2011-09-02 NOTE — Discharge Summary (Signed)
Pt verbalizes understanding of all discharge instructions.

## 2011-09-02 NOTE — ED Provider Notes (Signed)
History    Rebecca Delacruz is a 25 YO [id number] at [redacted]w[redacted]d presents with shortness of breath and congestion.  No chief complaint on file.  HPI Patient reports she had been feeling ill with sinus congestion, productive cough, and wheezing since Wednesday, three days ago. She went to see her OBGYN at [facility] and told she had bronchitis. She received a Z-pack and steroids taper. Patient reports she was compliant with medication but became worse the following day. She returned to her [facility] for help and was sent to [facility] for treatment.  In antenatal, patient received Albuterol and Atrovent breathing treatments. Patient reports she felt much better than before but still not 100%.   OB History    Grav Para Term Preterm Abortions TAB SAB Ect Mult Living   1               Past Medical History  Diagnosis Date  . No pertinent past medical history   . Shortness of breath     Past Surgical History  Procedure Date  . No past surgeries     No family history on file.  History  Substance Use Topics  . Smoking status: Current Everyday Smoker -- 1.0 packs/day for 10 years    Types: Cigarettes  . Smokeless tobacco: Not on file  . Alcohol Use: No    Allergies: No Known Allergies  Prescriptions prior to admission  Medication Sig Dispense Refill  . azithromycin (ZITHROMAX) 250 MG tablet Take 250 mg by mouth daily. Started wed 08/31/11, still taking       . HYDROcodone-homatropine (HYCODAN) 5-1.5 MG/5ML syrup Take 5 mLs by mouth 2 (two) times daily as needed. Cough        . predniSONE (DELTASONE) 10 MG tablet Take 40 mg by mouth daily. For 10 days, filled 08/31/11         Review of Systems  Constitutional: Positive for fever, chills and malaise/fatigue.  Eyes: Negative for blurred vision.  Respiratory: Positive for cough, sputum production, shortness of breath and wheezing.   Cardiovascular: Positive for chest pain.  Gastrointestinal: Positive for nausea and vomiting.  Negative for abdominal pain, diarrhea and constipation.  Genitourinary: Negative for dysuria.  Musculoskeletal: Positive for myalgias.       Chest pain with deep inhalation & tender with palpation  Skin: Negative for itching and rash.  Neurological: Positive for weakness and headaches. Negative for dizziness.   Physical Exam   Blood pressure 109/65, pulse 121, temperature 98.5 F (36.9 C), resp. rate 22, height 5\' 1"  (1.549 m), weight 77.565 kg (171 lb), SpO2 98.00%.  Physical Exam  Constitutional: She is oriented to person, place, and time. She appears well-developed and well-nourished.  HENT:  Head: Normocephalic.  Eyes: Conjunctivae are normal. Pupils are equal, round, and reactive to light.  Neck: Normal range of motion. Neck supple.  Cardiovascular: Normal rate, regular rhythm and normal heart sounds.  Exam reveals no gallop and no friction rub.   No murmur heard. Respiratory: She has wheezes. She exhibits tenderness.       Crackles present in upper lobes anteriorly with normal breaths. Good deep respiratory effort, but causes chest discomfort. Patient has a 10 pack year history.   GI: Soft. Bowel sounds are normal. There is no tenderness.  Musculoskeletal: Normal range of motion. She exhibits no edema and no tenderness.  Neurological: She is alert and oriented to person, place, and time.  Skin: Skin is warm. No erythema.    MAU  Course  Procedures Albuterol & Atrovent breathing treatments: Improved status after tx. 97-99% on RA  Assessment and Plan  1. Upper Respiratory Infection/Bronchitis/Asthma: Informed patient to continue with z-pack and prednisone taper. Patient can use inhaler acutely, PRN. Patient understood. Encouraged patient to quit smoking, she reports she "might as well". Will give patient smoking cessation handout. Patient is to be discharged home. Will follow up with [facility], appointment made.  Mosetta Putt, PA-S 09/02/2011, 3:42 PM    I have  seen/examined this patient and agree with above assessment and plan.   Jermanie Minshall E. 09/02/2011 5:08 PM

## 2011-09-03 NOTE — ED Provider Notes (Signed)
Chart reviewed and agree with management and plan.  

## 2011-09-06 ENCOUNTER — Encounter (HOSPITAL_COMMUNITY): Payer: Self-pay

## 2011-10-04 NOTE — L&D Delivery Note (Signed)
Delivery Note At 7:11 AM a viable female was delivered via Vaginal, Spontaneous Delivery (Presentation: Left Occiput Anterior).  APGAR: 8, ; weight 7 lb 1.4 oz (3215 g).   Placenta status: Intact, Spontaneous.  Cord: 3 vessels with the following complications: None.    Anesthesia: None  Episiotomy: None Lacerations: None Suture Repair: n/a Est. Blood Loss (mL):   Mom to postpartum.  Baby to nursery-stable.  CRESENZO-DISHMAN,Hicks Feick 10/14/2011, 7:29 AM

## 2011-10-13 ENCOUNTER — Inpatient Hospital Stay (HOSPITAL_COMMUNITY)
Admission: AD | Admit: 2011-10-13 | Discharge: 2011-10-16 | DRG: 767 | Disposition: A | Discharge status: Home / Self Care | Payer: Medicaid Other | Source: Ambulatory Visit | Attending: Obstetrics & Gynecology | Admitting: Obstetrics & Gynecology

## 2011-10-13 ENCOUNTER — Encounter (HOSPITAL_COMMUNITY): Payer: Self-pay | Admitting: *Deleted

## 2011-10-13 DIAGNOSIS — IMO0001 Reserved for inherently not codable concepts without codable children: Secondary | ICD-10-CM

## 2011-10-13 DIAGNOSIS — Z302 Encounter for sterilization: Secondary | ICD-10-CM

## 2011-10-13 LAB — CBC
HCT: 31 % — ABNORMAL LOW (ref 36.0–46.0)
MCH: 29.4 pg (ref 26.0–34.0)
MCHC: 33.5 g/dL (ref 30.0–36.0)
MCV: 87.6 fL (ref 78.0–100.0)
RDW: 14.8 % (ref 11.5–15.5)

## 2011-10-13 MED ORDER — ACETAMINOPHEN 325 MG PO TABS: 650.0000 mg | ORAL_TABLET | ORAL | Status: DC | PRN

## 2011-10-13 MED ORDER — OXYTOCIN 20 UNITS IN LACTATED RINGERS INFUSION - SIMPLE: 125.0000 mL/h | Freq: Once | INTRAVENOUS | Status: DC

## 2011-10-13 MED ORDER — OXYTOCIN BOLUS FROM INFUSION
500.0000 mL | Freq: Once | INTRAVENOUS | Status: DC
  Filled 2011-10-13: qty 500

## 2011-10-13 MED ORDER — HYDROXYZINE HCL 50 MG PO TABS: 50.0000 mg | ORAL_TABLET | Freq: Four times a day (QID) | ORAL | Status: DC | PRN

## 2011-10-13 MED ORDER — NALBUPHINE SYRINGE 5 MG/0.5 ML: 5.0000 mg | INJECTION | INTRAMUSCULAR | Status: DC | PRN

## 2011-10-13 MED ORDER — LIDOCAINE HCL (PF) 1 % IJ SOLN: 30.0000 mL | INTRAMUSCULAR | Status: DC | PRN

## 2011-10-13 MED ORDER — IBUPROFEN 600 MG PO TABS: 600.0000 mg | ORAL_TABLET | Freq: Four times a day (QID) | ORAL | Status: DC | PRN

## 2011-10-13 MED ORDER — CITRIC ACID-SODIUM CITRATE 334-500 MG/5ML PO SOLN
30.0000 mL | ORAL | Status: DC | PRN
  Administered 2011-10-14: 30 mL via ORAL
  Filled 2011-10-13: qty 15

## 2011-10-13 MED ORDER — LACTATED RINGERS IV SOLN: INTRAVENOUS | Status: DC

## 2011-10-13 MED ORDER — LACTATED RINGERS IV SOLN: 500.0000 mL | INTRAVENOUS | Status: DC | PRN

## 2011-10-13 MED ORDER — HYDROXYZINE HCL 50 MG/ML IM SOLN: 50.0000 mg | Freq: Four times a day (QID) | INTRAMUSCULAR | Status: DC | PRN

## 2011-10-13 MED ORDER — ONDANSETRON HCL 4 MG/2ML IJ SOLN: 4.0000 mg | Freq: Four times a day (QID) | INTRAMUSCULAR | Status: DC | PRN

## 2011-10-13 MED ORDER — OXYCODONE-ACETAMINOPHEN 5-325 MG PO TABS: 2.0000 | ORAL_TABLET | ORAL | Status: DC | PRN

## 2011-10-13 MED ORDER — FLEET ENEMA 7-19 GM/118ML RE ENEM: 1.0000 | ENEMA | RECTAL | Status: DC | PRN

## 2011-10-13 NOTE — Progress Notes (Signed)
SAYS PAIN IS NOT UC-  BUT HAS PAIN IN HER VAGINA AND  RECTUM- STARTED  830PM

## 2011-10-13 NOTE — H&P (Signed)
Rebecca Delacruz is a 26 y.o. female [phone] with IUP at [redacted]w[redacted]d presenting for contractions. Pt states she has been having regular, every 3-4 minutes contractions, associated with none vaginal bleeding, membranes are intact, with active fetal movement.   [facility] at [facility] since 8  Wks.  Was seen today by myself in the office and was 3cms. GBS culture obtained this am and is pending  Prenatal History/Complications:  Past Medical History: Past Medical History  Diagnosis Date  . Ovarian cyst   . Gonorrhea   . Trichomonas   . Constipation   . No pertinent past medical history   . Shortness of breath     Past Surgical History: Past Surgical History  Procedure Date  . No past surgeries     Obstetrical History: OB History    Grav Para Term Preterm Abortions TAB SAB Ect Mult Living   1 3 2 1 1     2      Obstetric Comments   Pt reported Abruption as part of del of FDIU, in 2010, but records deny any mention of abruption.  Pt had known FDIU prior to presentation in active labor, delivering promptly upon arrival  Urine Drug screen neg at time of del.      Gynecological History: OB History    Grav Para Term Preterm Abortions TAB SAB Ect Mult Living   1 3 2 1 1     2      Obstetric Comments   Pt reported Abruption as part of del of FDIU, in 2010, but records deny any mention of abruption.  Pt had known FDIU prior to presentation in active labor, delivering promptly upon arrival  Urine Drug screen neg at time of del.      Social History: History   Social History  . Marital Status: Single    Spouse Name: N/A    Number of Children: N/A  . Years of Education: N/A   Social History Main Topics  . Smoking status: Current Everyday Smoker -- 1.0 packs/day for 10 years    Types: Cigarettes  . Smokeless tobacco: None  . Alcohol Use: No  . Drug Use: No     Smoked marijuana 1 week ago, uses every 3-4 months.  . Sexually Active: Yes    Birth Control/ Protection: None   Other  Topics Concern  . None   Social History Narrative   ** Merged History Encounter **     Family History: Family History  Problem Relation Age of Onset  . Diabetes Mother     Allergies: No Known Allergies  Prescriptions prior to admission  Medication Sig Dispense Refill  . albuterol (PROVENTIL HFA;VENTOLIN HFA) 108 (90 BASE) MCG/ACT inhaler Inhale 2 puffs into the lungs every 4 (four) hours as needed for wheezing or shortness of breath.  1 Inhaler  1    Review of Systems - Negative    Blood pressure 117/59, pulse 101, temperature 98 F (36.7 C), temperature source Oral, height 5\' 3"  (1.6 m), weight 80.457 kg (177 lb 6 oz), unknown if currently breastfeeding. General appearance: alert, cooperative and no distress Lungs: clear to auscultation bilaterally Heart: regular rate and rhythm Abdomen: soft, non-tender; bowel sounds normal Pelvic: 4-5/75/-2BBOW Extremities: Homans sign is negative, no sign of DVT DTR's 2+ Presentation: cephalic Fetal monitoringBaseline: 140 bpm, Variability: Good {> 6 bpm), Accelerations: Reactive and Decelerations: Absent Uterine activityDate/time of onset: 1500; q 3 minutes, mild to moderate Dilation: 4.5 Exam by:: CresDishmon,CNM   Prenatal labs:  ABO, Rh:  B+ Antibody:  neg Rubella:  immune RPR:   neg HBsAg:   neg HIV:   neg GBS:  pending 2 hr GTT:  75-125-105 Genetic screening  normal Anatomy US normal    Assessment: Rebecca Delacruz is a 26 y.o. [phone] with an IUP at [redacted]w[redacted]d presenting for active labor   Plan: Expectant management   CRESENZO-DISHMAN,Dorothymae Maciver 10/13/2011, 11:03 PM

## 2011-10-13 NOTE — Progress Notes (Signed)
PT SAYS SHE WAS IN OFFICE   TODAY- VE - FRAN- 3 CM.    HURT BAD  SINCE 830PM. DENIES HSV AND MRSA.

## 2011-10-14 ENCOUNTER — Encounter (HOSPITAL_COMMUNITY): Payer: Self-pay | Admitting: *Deleted

## 2011-10-14 MED ORDER — IBUPROFEN 600 MG PO TABS
600.0000 mg | ORAL_TABLET | Freq: Four times a day (QID) | ORAL | Status: DC
  Administered 2011-10-14 – 2011-10-16 (×8): 600 mg via ORAL
  Filled 2011-10-14 (×8): qty 1

## 2011-10-14 MED ORDER — DIPHENHYDRAMINE HCL 25 MG PO CAPS: 25.0000 mg | ORAL_CAPSULE | Freq: Four times a day (QID) | ORAL | Status: DC | PRN

## 2011-10-14 MED ORDER — SENNOSIDES-DOCUSATE SODIUM 8.6-50 MG PO TABS
2.0000 | ORAL_TABLET | Freq: Every day | ORAL | Status: DC
  Administered 2011-10-14 – 2011-10-15 (×2): 2 via ORAL

## 2011-10-14 MED ORDER — DIBUCAINE 1 % RE OINT: 1.0000 "application " | TOPICAL_OINTMENT | RECTAL | Status: DC | PRN

## 2011-10-14 MED ORDER — TERBUTALINE SULFATE 1 MG/ML IJ SOLN: 0.2500 mg | Freq: Once | INTRAMUSCULAR | Status: DC | PRN

## 2011-10-14 MED ORDER — METOCLOPRAMIDE HCL 10 MG PO TABS
10.0000 mg | ORAL_TABLET | Freq: Once | ORAL | Status: DC
  Filled 2011-10-14: qty 1

## 2011-10-14 MED ORDER — WITCH HAZEL-GLYCERIN EX PADS: 1.0000 "application " | MEDICATED_PAD | CUTANEOUS | Status: DC | PRN

## 2011-10-14 MED ORDER — TETANUS-DIPHTH-ACELL PERTUSSIS 5-2.5-18.5 LF-MCG/0.5 IM SUSP: 0.5000 mL | Freq: Once | INTRAMUSCULAR | Status: DC

## 2011-10-14 MED ORDER — FAMOTIDINE 20 MG PO TABS
40.0000 mg | ORAL_TABLET | Freq: Once | ORAL | Status: AC
  Administered 2011-10-15: 40 mg via ORAL
  Filled 2011-10-14: qty 2

## 2011-10-14 MED ORDER — BENZOCAINE-MENTHOL 20-0.5 % EX AERO: 1.0000 "application " | INHALATION_SPRAY | CUTANEOUS | Status: DC | PRN

## 2011-10-14 MED ORDER — ZOLPIDEM TARTRATE 10 MG PO TABS: 10.0000 mg | ORAL_TABLET | Freq: Every evening | ORAL | Status: DC | PRN

## 2011-10-14 MED ORDER — LANOLIN HYDROUS EX OINT: TOPICAL_OINTMENT | CUTANEOUS | Status: DC | PRN

## 2011-10-14 MED ORDER — LACTATED RINGERS IV SOLN
INTRAVENOUS | Status: DC
  Administered 2011-10-15 (×2): via INTRAVENOUS
  Administered 2011-10-15: 20 mL/h via INTRAVENOUS

## 2011-10-14 MED ORDER — PRENATAL MULTIVITAMIN CH
1.0000 | ORAL_TABLET | Freq: Every day | ORAL | Status: DC
  Administered 2011-10-14: 1 via ORAL
  Filled 2011-10-14: qty 1

## 2011-10-14 MED ORDER — OXYTOCIN 20 UNITS IN LACTATED RINGERS INFUSION - SIMPLE
1.0000 m[IU]/min | INTRAVENOUS | Status: DC
  Administered 2011-10-14: 2 m[IU]/min via INTRAVENOUS
  Filled 2011-10-14: qty 1000

## 2011-10-14 MED ORDER — SIMETHICONE 80 MG PO CHEW: 80.0000 mg | CHEWABLE_TABLET | ORAL | Status: DC | PRN

## 2011-10-14 MED ORDER — ONDANSETRON HCL 4 MG PO TABS: 4.0000 mg | ORAL_TABLET | ORAL | Status: DC | PRN

## 2011-10-14 MED ORDER — ONDANSETRON HCL 4 MG/2ML IJ SOLN: 4.0000 mg | INTRAMUSCULAR | Status: DC | PRN

## 2011-10-14 MED ORDER — OXYCODONE-ACETAMINOPHEN 5-325 MG PO TABS
1.0000 | ORAL_TABLET | ORAL | Status: DC | PRN
  Administered 2011-10-14 – 2011-10-15 (×7): 1 via ORAL
  Administered 2011-10-15: 2 via ORAL
  Filled 2011-10-14: qty 2
  Filled 2011-10-14: qty 1
  Filled 2011-10-14: qty 2
  Filled 2011-10-14 (×5): qty 1

## 2011-10-14 MED ORDER — ZOLPIDEM TARTRATE 5 MG PO TABS: 5.0000 mg | ORAL_TABLET | Freq: Every evening | ORAL | Status: DC | PRN

## 2011-10-14 NOTE — Progress Notes (Signed)
UR Chart review completed.  

## 2011-10-14 NOTE — Progress Notes (Signed)
   Rebecca Delacruz is a 26 y.o. [phone] at [redacted]w[redacted]d  admitted for active labor  Subjective: Feels like contractions have spaced out  Objective: BP 114/58  Pulse 92  Temp(Src) 98.4 F (36.9 C) (Oral)  Resp 18  Ht 5\' 2"  (1.575 m)  Wt 80.74 kg (178 lb)  BMI 32.56 kg/m2  Breastfeeding? Unknown    FHT:  FHR: 120 bpm, variability: moderate,  accelerations:  Present,  decelerations:  Absent UC:   irregular, every 5-6 minutes SVE:   Dilation: 6.5 Effacement (%): 90 Station: 0 Exam by:: H.Norton, RN   Labs: Lab Results  Component Value Date   WBC 8.8 10/13/2011   HGB 10.4* 10/13/2011   HCT 31.0* 10/13/2011   MCV 87.6 10/13/2011   PLT 154 10/13/2011    Assessment / Plan: Protracted active phase  Labor: protracted Fetal Wellbeing:  Category I Pain Control:  Labor support without medications Anticipated MOD:  NSVD  CRESENZO-DISHMAN,Shona Pardo 10/14/2011, 6:01 AM

## 2011-10-14 NOTE — Progress Notes (Signed)
   Rebecca Delacruz is a 26 y.o. [phone] at [redacted]w[redacted]d  admitted for active labor  Subjective: Doing well with contractions  Objective: BP 98/56  Pulse 85  Temp(Src) 97.8 F (36.6 C) (Oral)  Resp 20  Ht 5\' 2"  (1.575 m)  Wt 80.74 kg (178 lb)  BMI 32.56 kg/m2  Breastfeeding? Unknown    FHT:  FHR: 140 bpm, variability: moderate,  accelerations:  Present,  decelerations:  Absent UC:   irregular, every 5-6 minutes SVE:   6/80/-3.  AROM with clear fluid  Labs: Lab Results  Component Value Date   WBC 8.8 10/13/2011   HGB 10.4* 10/13/2011   HCT 31.0* 10/13/2011   MCV 87.6 10/13/2011   PLT 154 10/13/2011    Assessment / Plan: Spontaneous labor, progressing normally  Labor: Progressing normally Fetal Wellbeing:  Category I Pain Control:  Labor support without medications Anticipated MOD:  NSVD  CRESENZO-DISHMAN,Kirtis Challis 10/14/2011, 2:21 AM

## 2011-10-15 ENCOUNTER — Inpatient Hospital Stay (HOSPITAL_COMMUNITY): Payer: Medicaid Other | Admitting: Anesthesiology

## 2011-10-15 ENCOUNTER — Encounter (HOSPITAL_COMMUNITY): Payer: Self-pay | Admitting: Anesthesiology

## 2011-10-15 ENCOUNTER — Encounter (HOSPITAL_COMMUNITY)
Admission: AD | Disposition: A | Discharge status: Home / Self Care | Payer: Self-pay | Source: Ambulatory Visit | Attending: Obstetrics & Gynecology

## 2011-10-15 DIAGNOSIS — Z302 Encounter for sterilization: Secondary | ICD-10-CM

## 2011-10-15 HISTORY — PX: TUBAL LIGATION: SHX77

## 2011-10-15 SURGERY — LIGATION, FALLOPIAN TUBE, POSTPARTUM
Anesthesia: Spinal | Site: Abdomen | Laterality: Bilateral | Wound class: Clean

## 2011-10-15 MED ORDER — PROMETHAZINE HCL 25 MG/ML IJ SOLN: 6.2500 mg | INTRAMUSCULAR | Status: DC | PRN

## 2011-10-15 MED ORDER — LIDOCAINE HCL (CARDIAC) 20 MG/ML IV SOLN
INTRAVENOUS | Status: AC
  Filled 2011-10-15: qty 5

## 2011-10-15 MED ORDER — LACTATED RINGERS IV BOLUS (SEPSIS)
1000.0000 mL | Freq: Once | INTRAVENOUS | Status: AC
  Administered 2011-10-15: 1000 mL via INTRAVENOUS

## 2011-10-15 MED ORDER — EPHEDRINE SULFATE 50 MG/ML IJ SOLN
INTRAMUSCULAR | Status: DC | PRN
  Administered 2011-10-15: 10 mg via INTRAVENOUS

## 2011-10-15 MED ORDER — FENTANYL CITRATE 0.05 MG/ML IJ SOLN: 25.0000 ug | INTRAMUSCULAR | Status: DC | PRN

## 2011-10-15 MED ORDER — SODIUM BICARBONATE 8.4 % IV SOLN
INTRAVENOUS | Status: AC
  Filled 2011-10-15: qty 50

## 2011-10-15 MED ORDER — MIDAZOLAM HCL 2 MG/2ML IJ SOLN
INTRAMUSCULAR | Status: AC
  Filled 2011-10-15: qty 2

## 2011-10-15 MED ORDER — BUPIVACAINE HCL 0.75 % IJ SOLN
INTRAMUSCULAR | Status: DC | PRN
  Administered 2011-10-15: 11 mg via INTRATHECAL

## 2011-10-15 MED ORDER — EPHEDRINE 5 MG/ML INJ
INTRAVENOUS | Status: AC
  Filled 2011-10-15: qty 10

## 2011-10-15 MED ORDER — BUPIVACAINE HCL (PF) 0.25 % IJ SOLN
INTRAMUSCULAR | Status: DC | PRN
  Administered 2011-10-15: 6 mL

## 2011-10-15 MED ORDER — SODIUM CHLORIDE 0.9 % IR SOLN
Status: DC | PRN
  Administered 2011-10-15: 1000 mL

## 2011-10-15 MED ORDER — KETOROLAC TROMETHAMINE 30 MG/ML IJ SOLN
INTRAMUSCULAR | Status: AC
  Filled 2011-10-15: qty 1

## 2011-10-15 MED ORDER — KETOROLAC TROMETHAMINE 30 MG/ML IJ SOLN: 15.0000 mg | Freq: Once | INTRAMUSCULAR | Status: DC | PRN

## 2011-10-15 MED ORDER — MIDAZOLAM HCL 5 MG/5ML IJ SOLN
INTRAMUSCULAR | Status: DC | PRN
  Administered 2011-10-15: 2 mg via INTRAVENOUS

## 2011-10-15 MED ORDER — FENTANYL CITRATE 0.05 MG/ML IJ SOLN
INTRAMUSCULAR | Status: AC
  Filled 2011-10-15: qty 5

## 2011-10-15 MED ORDER — ACETAMINOPHEN 325 MG PO TABS: 325.0000 mg | ORAL_TABLET | ORAL | Status: DC | PRN

## 2011-10-15 SURGICAL SUPPLY — 19 items
CLIP FILSHIE TUBAL LIGA STRL (Clip) ×2 IMPLANT
CLOTH BEACON ORANGE TIMEOUT ST (SAFETY) ×2 IMPLANT
CONTAINER PREFILL 10% NBF 15ML (MISCELLANEOUS) IMPLANT
DRSG COVADERM PLUS 2X2 (GAUZE/BANDAGES/DRESSINGS) ×2 IMPLANT
GLOVE BIOGEL PI IND STRL 9 (GLOVE) ×2 IMPLANT
GLOVE BIOGEL PI INDICATOR 9 (GLOVE) ×2
GLOVE ECLIPSE 9.0 STRL (GLOVE) ×2 IMPLANT
GOWN PREVENTION PLUS LG XLONG (DISPOSABLE) ×2 IMPLANT
GOWN PREVENTION PLUS XXLARGE (GOWN DISPOSABLE) IMPLANT
GOWN STRL REIN 3XL LVL4 (GOWN DISPOSABLE) ×2 IMPLANT
NS IRRIG 1000ML POUR BTL (IV SOLUTION) ×2 IMPLANT
PACK ABDOMINAL MINOR (CUSTOM PROCEDURE TRAY) ×2 IMPLANT
SPONGE LAP 4X18 X RAY DECT (DISPOSABLE) ×2 IMPLANT
SUT VIC AB 0 CT1 27 (SUTURE) ×1
SUT VIC AB 0 CT1 27XBRD ANBCTR (SUTURE) ×1 IMPLANT
SUT VIC AB 4-0 PS2 27 (SUTURE) ×2 IMPLANT
TOWEL OR 17X24 6PK STRL BLUE (TOWEL DISPOSABLE) ×4 IMPLANT
TRAY FOLEY CATH 14FR (SET/KITS/TRAYS/PACK) ×2 IMPLANT
WATER STERILE IRR 1000ML POUR (IV SOLUTION) IMPLANT

## 2011-10-15 NOTE — Anesthesia Postprocedure Evaluation (Signed)
Anesthesia Post Note  Patient: Rebecca Delacruz  Procedure(s) Performed:  POST PARTUM TUBAL LIGATION - Post partum tubal ligation bilateral with filshie clips  Anesthesia type: Spinal  Patient location: PACU  Post pain: Pain level controlled  Post assessment: Post-op Vital signs reviewed  Last Vitals:  Filed Vitals:   10/15/11 0900  BP: 124/68  Pulse: 69  Temp:   Resp: 16    Post vital signs: Reviewed  Level of consciousness: awake  Complications: No apparent anesthesia complications

## 2011-10-15 NOTE — Brief Op Note (Signed)
10/13/2011 - 10/15/2011  8:59 AM  PATIENT:  Rebecca Delacruz  25 y.o. female  PRE-OPERATIVE DIAGNOSIS:  desires sterilization  POST-OPERATIVE DIAGNOSIS:  desires sterilization  PROCEDURE:  Procedure(s): POST PARTUM TUBAL LIGATION Filshie Clips  SURGEON:  Surgeon(s): Tilda Burrow, MD  PHYSICIAN ASSISTANT:   ASSISTANTS: none   ANESTHESIA:   local and spinal  EBL:  Total I/O In: 1600 [I.V.:1600] Out: 310 [Urine:300; Blood:10]  BLOOD ADMINISTERED:none  DRAINS: Urinary Catheter (Foley)   LOCAL MEDICATIONS USED:  MARCAINE 6CC  SPECIMEN:  No Specimen  DISPOSITION OF SPECIMEN:  N/A  COUNTS:  YES  TOURNIQUET:  * No tourniquets in log *  DICTATION: .Dragon Dictation patient was taken operating room prepped and draped for umbilical procedure, after spinal anesthesia introduced. Timeout had been conducted and patient confirmed desire for permanent sterilization prior to procedure An infraumbilical semicircular 2 cm skin incision was made with sharp dissection to the fascia which was opened in the midline and peritoneal cavity bluntly entered without Depakote. Right fallopian tube was identified to its fimbriated end, elevated and visualized easily with cut clamps, and a Filshie clip placed at its midportion. Clip was confirmed as being securely placed. The left ear was treated in a similar fashion.. Fascia was closed with running 0 Vicryl ends Marcaine was injected around the incision. The subcuticular 4-0 Vicryl completed the procedure sponge and needle counts correct. condition to recovery room good.  PLAN OF CARE: admitted already  PATIENT DISPOSITION:  PACU - hemodynamically stable.   Delay start of Pharmacological VTE agent (>24hrs) due to surgical blood loss or risk of bleeding:  {YES/NO/NOT APPLICABLE:20182

## 2011-10-15 NOTE — Progress Notes (Signed)
Post Partum Day 2 Subjective: no complaints, up ad lib, voiding, tolerating PO and + flatus  Objective: Blood pressure 114/75, pulse 75, temperature 97.9 F (36.6 C), temperature source Oral, resp. rate 18, height 5\' 2"  (1.575 m), weight 80.74 kg (178 lb), SpO2 95.00%, unknown if currently breastfeeding.  Physical Exam:  General: alert, cooperative and no distress Lochia: appropriate Uterine Fundus: firm Incision: n/a DVT Evaluation: No evidence of DVT seen on physical exam. Negative Homan's sign.   Basename 10/13/11 2345  HGB 10.4*  HCT 31.0*    Assessment/Plan: Going now for tubal ligation. Bottle feeding. Circumcision: Outpatient.   LOS: 2 days   D. Piloto [employer]. MD PGY-1  10/15/2011, 7:45 AM

## 2011-10-15 NOTE — Anesthesia Procedure Notes (Signed)
Spinal  Patient location during procedure: OR Start time: 10/15/2011 8:02 AM Staffing Anesthesiologist: Brayton Caves R Performed by: anesthesiologist  Preanesthetic Checklist Completed: patient identified, site marked, surgical consent, pre-op evaluation, timeout performed, IV checked, risks and benefits discussed and monitors and equipment checked Spinal Block Patient position: sitting Prep: DuraPrep Patient monitoring: heart rate, cardiac monitor, continuous pulse ox and blood pressure Approach: midline Location: L3-4 Injection technique: single-shot Needle Needle type: Sprotte  Needle gauge: 24 G Needle length: 9 cm Assessment Sensory level: T4 Additional Notes Patient identified.  Risk benefits discussed including failed block, incomplete pain control, headache, nerve damage, paralysis, blood pressure changes, nausea, vomiting, reactions to medication both toxic or allergic, and postpartum back pain.  Patient expressed understanding and wished to proceed.  All questions were answered.  Sterile technique used throughout procedure.  CSF was clear.  No parasthesia or other complications.  Please see nursing notes for vital signs.

## 2011-10-15 NOTE — Transfer of Care (Signed)
Immediate Anesthesia Transfer of Care Note  Patient: Rebecca Delacruz  Procedure(s) Performed:  POST PARTUM TUBAL LIGATION - Post partum tubal ligation bilateral with filshie clips  Patient Location: PACU  Anesthesia Type: Spinal  Level of Consciousness: awake and alert   Airway & Oxygen Therapy: Patient Spontanous Breathing  Post-op Assessment: Report given to PACU RN and Post -op Vital signs reviewed and stable  Post vital signs: Reviewed and stable Filed Vitals:   10/15/11 0535  BP: 114/75  Pulse: 75  Temp: 36.6 C  Resp: 18    Complications: No apparent anesthesia complications

## 2011-10-15 NOTE — Progress Notes (Signed)
Post Partum Day 1 Subjective: no complaints and desires tubal this morning. has been npo  Objective: Blood pressure 99/59, pulse 70, temperature 98 F (36.7 C), temperature source Oral, resp. rate 16, height 5\' 2"  (1.575 m), weight 80.74 kg (178 lb), SpO2 95.00%, unknown if currently breastfeeding.  Physical Exam:  General: alert, cooperative and no distress Lochia: appropriate Uterine Fundus: firm Incision: none DVT Evaluation: No evidence of DVT seen on physical exam.   Basename 10/13/11 2345  HGB 10.4*  HCT 31.0*    Assessment/Plan: Contraception Tubal to be done this morning . Will attempt to change to 7:30    LOS: 2 days   Rebecca Delacruz V 10/15/2011, 6:23 AM

## 2011-10-15 NOTE — Progress Notes (Signed)
Brought pt. Back from PACU via stretcher and assissted her in to bed #121. Pt's s.o. In room with the baby. Pt. Without c/o at this time.

## 2011-10-15 NOTE — Anesthesia Preprocedure Evaluation (Signed)
Anesthesia Evaluation  Patient identified by MRN, date of birth, ID band Patient awake    Reviewed: Allergy & Precautions, H&P , Patient's Chart, lab work & pertinent test results  Airway Mallampati: II      Dental No notable dental hx.    Pulmonary neg pulmonary ROS, shortness of breath and with exertion, Current Smoker,  clear to auscultation  Pulmonary exam normal       Cardiovascular Exercise Tolerance: Good neg cardio ROS regular Normal    Neuro/Psych Negative Neurological ROS  Negative Psych ROS   GI/Hepatic negative GI ROS, Neg liver ROS,   Endo/Other  Negative Endocrine ROS  Renal/GU negative Renal ROS  Genitourinary negative   Musculoskeletal   Abdominal Normal abdominal exam  (+)   Peds  Hematology negative hematology ROS (+)   Anesthesia Other Findings   Reproductive/Obstetrics negative OB ROS                           Anesthesia Physical Anesthesia Plan  ASA: II  Anesthesia Plan: Spinal   Post-op Pain Management:    Induction:   Airway Management Planned:   Additional Equipment:   Intra-op Plan:   Post-operative Plan:   Informed Consent: I have reviewed the patients History and Physical, chart, labs and discussed the procedure including the risks, benefits and alternatives for the proposed anesthesia with the patient or authorized representative who has indicated his/her understanding and acceptance.     Plan Discussed with: Anesthesiologist, CRNA and Surgeon  Anesthesia Plan Comments:         Anesthesia Quick Evaluation

## 2011-10-15 NOTE — Progress Notes (Signed)
PSYCHOSOCIAL ASSESSMENT ~ MATERNAL/CHILD Name:   Bufford Buttner       Age: 26    Referral Date: 10/14/2011   Reason/Source: Hx of substance use/perior children not on mom's care/Central Nursery  I. FAMILY/HOME ENVIRONMENT A. Child's Legal Guardian Parent(s)     Name:  Taquila Leys  DOB: [birth year]    Age:  26 Address:  [address] [place], [place] [address] Name: Carles Collet Address: same B. Other Household Members/Support Persons:  N/A  C.   Other Support:  Feliz Beam Galloway-paternal uncle, Gwendolyn Galloway-paternal grandma  II. PSYCHOSOCIAL DATA A. Information Source X-Patient Interview  X-Family Interview              B. Financial and Community Resources Employment-FOB car detailing   X-Medicaid-MOB [place]-plans to set baby up as well     X-Food Stamps      X-WIC-MOB to call to set baby up   [employer], business major  C. Cultural and Environment Information/Cultural Issues Impacting Care-N/A III. STRENGTHS X-Supportive family/friends-paternal family support   X-Adequate Resources  X-Compliance with medical plan   IV. RISK FACTORS AND CURRENT PROBLEMS                  X-Substance Abuse-Maternal and Paternal histories         X-Family/Relationship Issues-MOB asked to have a confidential presence here (no information given to anyone) reportedly due to problems with her cousin whom she is concerned is calling about her                                X-DSS Involvement-DSS has been involved in the past due to substance use, homelessness, psychosocial risk factors.  Her two other children are not in MOB care.                                                   V. SOCIAL WORK ASSESSMENT LCSW and CPS worker, Irine Seal, met with MOB and FOB at bedside to assess strengths, needs and resources.  MOB has a significant DSS history due to Substance use, IUFD while testing positive for substances, homelessness, and other two  children are no longer in her care.  MOB reports she and FOB have been together for four years.  They have another child together who is not in their care.  Maternal cousin reportedly has custody of MOB other two children.  MOB reports that she does not associate with her family due to this reason.  MOB feels her family does not have her best interest at heart.  She feels that cousin called CPS on her.  MOB has had past substance use treatment.  MOB reports not using substances for approximately 7 months, though records indicate more recent use.  FOB also denies current substance use, reporting his last known use years ago.  Both parents deny alcohol use.  They report having all needed supplies for baby, except for bassinet, which MOB says FOB will buy today.  MOB did not have reference to give to CPS, though FOB was able to provide his brother and mother as references to corroborate their information and identify any additional needs.  MOB was unhappy with the idea of having to wait to know whether or not baby could  discharge home with her.  CPS worker validated her concerns, and emphasized the desire of CPS to keep families together.  CPS worker contacted her supervisor to see if a home visit could be made this weekend, though that was not an option.  CPS discussed that assigned worker, [place] Houchins [phone]) [phone], ext. [phone] would likely make home visit on Monday and determination for baby's discharge would happen after that.  MOB again was displeased, though cooperative.  MOB and FOB signed safety plan discussed by CPS.  They were appropriate, responsive and loving towards baby during visit.  They understand they can contact SW staff during their stay for additional support if needed.  Teaching Service Pediatrician made aware of CPS involvement.  VI. SOCIAL WORK PLAN X Psychosocial Support and Ongoing Assessment of Needs X Child Protective Services Report- [place]:  [place]   Date: 10/15/2011  Staci Acosta, LCSW, 10/15/2011, 4:48, pm

## 2011-10-16 MED ORDER — PRENATAL MULTIVITAMIN CH: 1.0000 | ORAL_TABLET | Freq: Every day | ORAL | Status: DC

## 2011-10-16 MED ORDER — IBUPROFEN 600 MG PO TABS: 600.0000 mg | ORAL_TABLET | Freq: Four times a day (QID) | ORAL | Status: AC

## 2011-10-16 MED ORDER — OXYCODONE-ACETAMINOPHEN 5-325 MG PO TABS: 1.0000 | ORAL_TABLET | ORAL | Status: AC | PRN

## 2011-10-16 NOTE — Op Note (Signed)
See Brief Op note for [employer] detailed op note.

## 2011-10-16 NOTE — Progress Notes (Cosign Needed)
Post Partum Day 2 Subjective: no complaints, up ad lib, voiding, tolerating PO and + flatus Has bilateral tubal ligation with no complication. Objective: Blood pressure 105/68, pulse 73, temperature 98.2 F (36.8 C), temperature source Oral, resp. rate 18, height 5\' 2"  (1.575 m), weight 80.74 kg (178 lb), SpO2 99.00%, unknown if currently breastfeeding.  Physical Exam:  General: alert, cooperative and no distress Lochia: appropriate Uterine Fundus: firm Incision: n/a DVT Evaluation: No evidence of DVT seen on physical exam. Negative Homan's sign.   Basename 10/13/11 2345  HGB 10.4*  HCT 31.0*    Assessment/Plan: Discharge home Bottle feeding. Circumcision as outpatient.   LOS: 3 days    D. Piloto [employer]. MD PGY-1  10/16/2011, 8:30 AM

## 2011-10-17 ENCOUNTER — Encounter (HOSPITAL_COMMUNITY): Payer: Self-pay | Admitting: Obstetrics and Gynecology

## 2011-10-17 NOTE — H&P (Signed)
Agree with above note.  Rebecca Humble H. 10/17/2011 5:47 PM

## 2011-10-18 NOTE — Discharge Summary (Signed)
Agree with above note

## 2011-10-18 NOTE — Discharge Summary (Signed)
Obstetric Discharge Summary Reason for Admission: onset of labor Prenatal Procedures: F/u gestation since  [redacted] weeks pregnant. Intrapartum Procedures: spontaneous vaginal delivery Postpartum Procedures: P.P. tubal ligation Complications-Operative and Postpartum: none Hemoglobin  Date Value Range Status  10/13/2011 10.4* 12.0-15.0 (g/dL) Final     HCT  Date Value Range Status  10/13/2011 31.0* 36.0-46.0 (%) Final    Discharge Diagnoses: Term Pregnancy-delivered  Discharge Information: Date: 10/18/2011 Activity: unrestricted Diet: routine Medications: Ibuprofen and Percocet Condition: improved Instructions: refer to practice specific booklet Discharge to: home Follow-up Information    Follow up with [facility] in 6 weeks. (make an appointment.)    Contact information:   [address] [place] [address] [phone]         Newborn Data: Live born female  Birth Weight: 7 lb 1.4 oz (3215 g) APGAR: 8, 9  Home with mother.  D. Piloto Sherron Flemings Paz. MD PGY-1 10/18/2011, 10:04 AM

## 2012-06-21 ENCOUNTER — Ambulatory Visit: Payer: Medicaid Other | Attending: Physical Medicine and Rehabilitation | Admitting: Physical Therapy

## 2012-06-21 DIAGNOSIS — IMO0001 Reserved for inherently not codable concepts without codable children: Secondary | ICD-10-CM | POA: Insufficient documentation

## 2012-06-21 DIAGNOSIS — M545 Low back pain, unspecified: Secondary | ICD-10-CM | POA: Insufficient documentation

## 2012-06-21 DIAGNOSIS — M542 Cervicalgia: Secondary | ICD-10-CM | POA: Insufficient documentation

## 2012-06-21 DIAGNOSIS — R293 Abnormal posture: Secondary | ICD-10-CM | POA: Insufficient documentation

## 2012-06-21 DIAGNOSIS — R5381 Other malaise: Secondary | ICD-10-CM | POA: Insufficient documentation

## 2012-06-26 ENCOUNTER — Ambulatory Visit: Payer: Medicaid Other | Admitting: Physical Therapy

## 2012-07-02 ENCOUNTER — Ambulatory Visit: Payer: Medicaid Other | Attending: Physical Medicine and Rehabilitation | Admitting: Physical Therapy

## 2012-07-02 DIAGNOSIS — R293 Abnormal posture: Secondary | ICD-10-CM | POA: Insufficient documentation

## 2012-07-02 DIAGNOSIS — M542 Cervicalgia: Secondary | ICD-10-CM | POA: Insufficient documentation

## 2012-07-02 DIAGNOSIS — IMO0001 Reserved for inherently not codable concepts without codable children: Secondary | ICD-10-CM | POA: Insufficient documentation

## 2012-07-02 DIAGNOSIS — M545 Low back pain, unspecified: Secondary | ICD-10-CM | POA: Insufficient documentation

## 2012-07-02 DIAGNOSIS — R5381 Other malaise: Secondary | ICD-10-CM | POA: Insufficient documentation

## 2012-07-11 ENCOUNTER — Encounter: Payer: Self-pay | Admitting: *Deleted

## 2012-09-09 ENCOUNTER — Emergency Department (HOSPITAL_COMMUNITY)
Admission: EM | Admit: 2012-09-09 | Discharge: 2012-09-09 | Disposition: A | Discharge status: Home / Self Care | Payer: Medicaid Other | Attending: Emergency Medicine | Admitting: Emergency Medicine

## 2012-09-09 ENCOUNTER — Emergency Department (HOSPITAL_COMMUNITY): Payer: Medicaid Other

## 2012-09-09 ENCOUNTER — Encounter (HOSPITAL_COMMUNITY): Payer: Self-pay | Admitting: Emergency Medicine

## 2012-09-09 DIAGNOSIS — F17200 Nicotine dependence, unspecified, uncomplicated: Secondary | ICD-10-CM | POA: Insufficient documentation

## 2012-09-09 DIAGNOSIS — J029 Acute pharyngitis, unspecified: Secondary | ICD-10-CM | POA: Insufficient documentation

## 2012-09-09 DIAGNOSIS — B9689 Other specified bacterial agents as the cause of diseases classified elsewhere: Secondary | ICD-10-CM

## 2012-09-09 DIAGNOSIS — M549 Dorsalgia, unspecified: Secondary | ICD-10-CM | POA: Insufficient documentation

## 2012-09-09 DIAGNOSIS — R05 Cough: Secondary | ICD-10-CM | POA: Insufficient documentation

## 2012-09-09 DIAGNOSIS — Z8742 Personal history of other diseases of the female genital tract: Secondary | ICD-10-CM | POA: Insufficient documentation

## 2012-09-09 DIAGNOSIS — J069 Acute upper respiratory infection, unspecified: Secondary | ICD-10-CM

## 2012-09-09 DIAGNOSIS — M7989 Other specified soft tissue disorders: Secondary | ICD-10-CM | POA: Insufficient documentation

## 2012-09-09 DIAGNOSIS — Z8719 Personal history of other diseases of the digestive system: Secondary | ICD-10-CM | POA: Insufficient documentation

## 2012-09-09 DIAGNOSIS — R350 Frequency of micturition: Secondary | ICD-10-CM | POA: Insufficient documentation

## 2012-09-09 DIAGNOSIS — R300 Dysuria: Secondary | ICD-10-CM | POA: Insufficient documentation

## 2012-09-09 DIAGNOSIS — Z8619 Personal history of other infectious and parasitic diseases: Secondary | ICD-10-CM | POA: Insufficient documentation

## 2012-09-09 DIAGNOSIS — R059 Cough, unspecified: Secondary | ICD-10-CM | POA: Insufficient documentation

## 2012-09-09 DIAGNOSIS — N760 Acute vaginitis: Secondary | ICD-10-CM | POA: Insufficient documentation

## 2012-09-09 LAB — URINE MICROSCOPIC-ADD ON

## 2012-09-09 LAB — URINALYSIS, ROUTINE W REFLEX MICROSCOPIC
Bilirubin Urine: NEGATIVE
Glucose, UA: NEGATIVE mg/dL
Leukocytes, UA: NEGATIVE
Nitrite: NEGATIVE
Protein, ur: NEGATIVE mg/dL
Specific Gravity, Urine: 1.01 (ref 1.005–1.030)
Urobilinogen, UA: 0.2 mg/dL (ref 0.0–1.0)
pH: 7.5 (ref 5.0–8.0)

## 2012-09-09 MED ORDER — IBUPROFEN 400 MG PO TABS
600.0000 mg | ORAL_TABLET | Freq: Once | ORAL | Status: AC
  Administered 2012-09-09: 600 mg via ORAL
  Filled 2012-09-09: qty 2

## 2012-09-09 MED ORDER — METRONIDAZOLE 500 MG PO TABS: 500.0000 mg | ORAL_TABLET | Freq: Two times a day (BID) | ORAL | Status: DC

## 2012-09-09 MED ORDER — NAPROXEN 500 MG PO TABS: 500.0000 mg | ORAL_TABLET | Freq: Two times a day (BID) | ORAL | Status: DC | PRN

## 2012-09-09 MED ORDER — OXYCODONE-ACETAMINOPHEN 5-325 MG PO TABS
1.0000 | ORAL_TABLET | Freq: Once | ORAL | Status: AC
  Administered 2012-09-09: 1 via ORAL
  Filled 2012-09-09: qty 1

## 2012-09-09 MED ORDER — METRONIDAZOLE 0.75 % VA GEL: 1.0000 | Freq: Two times a day (BID) | VAGINAL | Status: DC

## 2012-09-09 NOTE — ED Notes (Signed)
Cough x 3 days. Chest pain to sternal area started this am. Yellow phlegm.pain to chest worse with cough. Lower abd pain "mainly on right side" x 2-3 weeks. Denies vag. D/c. C/o pain with urination. Last normal bm today. Nad. Color wnl. Nondiaphoretic.

## 2012-09-09 NOTE — ED Provider Notes (Signed)
History  This chart was scribed for Rebecca Razor, MD by Shari Heritage, ED Scribe. The patient was seen in room APA15/APA15. Patient's care was started at 1659.  CSN: [id number]  Arrival date & time 09/09/12  1642   First MD Initiated Contact with Patient 09/09/12 1659      Chief Complaint  Patient presents with  . Chest Pain    The history is provided by the patient. No language interpreter was used.     HPI Comments: OKLA QAZI is a 26 y.o. female who presents to the Emergency Department with two complaints. Patient reports moderate to severe, constant, stabbing, non-radiating lower abdominal pain that is worse on the right side onset 2 weeks ago. Patient states that walking and exertion make pain worse. There is associated lower back pain, tingling with urination, and urinary frequency. LMP was 08/25/12. Patient has a surgical history of tubal ligation (10/15/11) and states that menstrual periods have been later than expected every month since then. Patient is also complaining of intermittent, sharp, moderate, non-radiating chest pain to sternal area onset this morning. There is associated cough with production of phlegm and blood, and sore throat. Patient reports leg and hand swelling, but she says this is baseline and not new. Patient denies vaginal bleeding, abnormal vaginal discharge, or rash. Patient medical history includes ovarian cyst, gonorrhea, and trichomonas. Patient is a current every day smoker.  Past Medical History  Diagnosis Date  . Ovarian cyst   . Gonorrhea   . Trichomonas   . Constipation   . No pertinent past medical history   . Shortness of breath     Past Surgical History  Procedure Date  . No past surgeries   . Tubal ligation 10/15/2011    Procedure: POST PARTUM TUBAL LIGATION;  Surgeon: Tilda Burrow, MD;  Location: [facility];  Service: Gynecology;  Laterality: Bilateral;  Post partum tubal ligation bilateral with filshie clips    Family History   Problem Relation Age of Onset  . Diabetes Mother   . Anesthesia problems Neg Hx   . Hypotension Neg Hx   . Malignant hyperthermia Neg Hx   . Pseudochol deficiency Neg Hx     History  Substance Use Topics  . Smoking status: Current Every Day Smoker -- 1.0 packs/day for 10 years    Types: Cigarettes  . Smokeless tobacco: Not on file  . Alcohol Use: No    OB History    Grav Para Term Preterm Abortions TAB SAB Ect Mult Living   5 4 3 1 1     3      Obstetric Comments   Pt reported Abruption as part of del of FDIU, in 2010, but records deny any mention of abruption.  Pt had known FDIU prior to presentation in active labor, delivering promptly upon arrival  Urine Drug screen neg at time of del.      Review of Systems  HENT: Positive for sore throat.   Respiratory: Positive for cough.   Cardiovascular: Positive for chest pain and leg swelling (baseline).  Gastrointestinal: Positive for abdominal pain.  Genitourinary: Positive for dysuria (tingling) and frequency. Negative for vaginal bleeding and vaginal discharge.  Musculoskeletal: Positive for back pain.  Skin: Negative for rash.  All other systems reviewed and are negative.    Allergies  Review of patient's allergies indicates no known allergies.  Home Medications   Current Outpatient Rx  Name  Route  Sig  Dispense  Refill  . ALBUTEROL  SULFATE HFA 108 (90 BASE) MCG/ACT IN AERS   Inhalation   Inhale 2 puffs into the lungs every 4 (four) hours as needed for wheezing or shortness of breath.   1 Inhaler   1   . PRENATAL MULTIVITAMIN CH   Oral   Take 1 tablet by mouth daily.   30 tablet   0     Triage Vitals: BP 108/72  Pulse 69  Temp 98.5 F (36.9 C) (Oral)  Resp 17  SpO2 100%  LMP 08/25/2012  Breastfeeding? No  Physical Exam  Nursing note and vitals reviewed. Constitutional: She appears well-developed and well-nourished. No distress.  HENT:  Head: Normocephalic and atraumatic.  Eyes: Conjunctivae  normal are normal. Right eye exhibits no discharge. Left eye exhibits no discharge.  Neck: Neck supple.  Cardiovascular: Normal rate, regular rhythm and normal heart sounds.  Exam reveals no gallop and no friction rub.   No murmur heard. Pulmonary/Chest: Effort normal and breath sounds normal. No respiratory distress. She exhibits tenderness.       Chest pain reproducible with palpation to upper sternum. No crepitus. No overlying skin lesions.   Abdominal: Soft. Bowel sounds are normal. She exhibits no distension. There is tenderness in the right lower quadrant and suprapubic area. There is CVA tenderness. There is no rebound and no guarding.       Right sided CVA tenderness. Mild RLQ and suprapubic tenderness without rebound or guarding. No distension.  Musculoskeletal: She exhibits no edema and no tenderness.  Neurological: She is alert.  Skin: Skin is warm and dry.  Psychiatric: She has a normal mood and affect. Her behavior is normal. Thought content normal.    ED Course  Procedures (including critical care time) DIAGNOSTIC STUDIES: Oxygen Saturation is 100% on room air, normal by my interpretation.    COORDINATION OF CARE: 5:21 PM- Patient informed of current plan for treatment and evaluation and agrees with plan at this time. Will review results of chest x-ray.   5:30PM- Medication orders: oxyCODONE-acetaminophen (PERCOCET/ROXICET) 5-325 MG per tablet 1 tablet, ibuprofen (ADVIL,MOTRIN) tablet 600 mg Once.   6:51PM- Updated patient on results of tests and scans. Cough symptoms are consistent with viral infection. No evidence of bronchitis or acute pulmonary or cardiac conditions. Patient's labs are consistent with bacterial vaginosis. Will prescribe antibiotic to treat.  Results for orders placed during the hospital encounter of 09/09/12  URINALYSIS, ROUTINE W REFLEX MICROSCOPIC      Component Value Range   Color, Urine YELLOW  YELLOW   APPearance CLEAR  CLEAR   Specific Gravity,  Urine 1.010  1.005 - 1.030   pH 7.5  5.0 - 8.0   Glucose, UA NEGATIVE  NEGATIVE mg/dL   Hgb urine dipstick TRACE (*) NEGATIVE   Bilirubin Urine NEGATIVE  NEGATIVE   Ketones, ur TRACE (*) NEGATIVE mg/dL   Protein, ur NEGATIVE  NEGATIVE mg/dL   Urobilinogen, UA 0.2  0.0 - 1.0 mg/dL   Nitrite NEGATIVE  NEGATIVE   Leukocytes, UA NEGATIVE  NEGATIVE  URINE MICROSCOPIC-ADD ON      Component Value Range   Squamous Epithelial / LPF MANY (*) RARE   WBC, UA 0-2  <3 WBC/hpf   RBC / HPF 0-2  <3 RBC/hpf   Bacteria, UA RARE  RARE   Urine-Other CLUE CELLS PRESENT      Dg Chest 2 View  09/09/2012  *RADIOLOGY REPORT*  Clinical Data: Chest pain  CHEST - 2 VIEW  Comparison: None.  Findings: Cardiomediastinal silhouette  is unremarkable.  No acute infiltrate or pleural effusion.  No pulmonary edema.  Bony thorax is unremarkable.  IMPRESSION: No active disease.   Original Report Authenticated By: Natasha Mead, M.D.      1. URI, acute   2. BV (bacterial vaginosis)       MDM        I personally preformed the services scribed in my presence. The recorded information has been reviewed is accurate. Rebecca Razor, MD.    Rebecca Razor, MD 09/13/12 [phone]

## 2012-11-21 ENCOUNTER — Emergency Department (HOSPITAL_COMMUNITY)
Admission: EM | Admit: 2012-11-21 | Discharge: 2012-11-21 | Disposition: A | Discharge status: Home / Self Care | Payer: Medicaid Other | Attending: Emergency Medicine | Admitting: Emergency Medicine

## 2012-11-21 ENCOUNTER — Encounter (HOSPITAL_COMMUNITY): Payer: Self-pay | Admitting: Emergency Medicine

## 2012-11-21 DIAGNOSIS — Z79899 Other long term (current) drug therapy: Secondary | ICD-10-CM | POA: Insufficient documentation

## 2012-11-21 DIAGNOSIS — F17200 Nicotine dependence, unspecified, uncomplicated: Secondary | ICD-10-CM | POA: Insufficient documentation

## 2012-11-21 DIAGNOSIS — Y990 Civilian activity done for income or pay: Secondary | ICD-10-CM | POA: Insufficient documentation

## 2012-11-21 DIAGNOSIS — M79609 Pain in unspecified limb: Secondary | ICD-10-CM | POA: Insufficient documentation

## 2012-11-21 DIAGNOSIS — Y9389 Activity, other specified: Secondary | ICD-10-CM | POA: Insufficient documentation

## 2012-11-21 DIAGNOSIS — Z8619 Personal history of other infectious and parasitic diseases: Secondary | ICD-10-CM | POA: Insufficient documentation

## 2012-11-21 DIAGNOSIS — S335XXA Sprain of ligaments of lumbar spine, initial encounter: Secondary | ICD-10-CM | POA: Insufficient documentation

## 2012-11-21 DIAGNOSIS — Y9289 Other specified places as the place of occurrence of the external cause: Secondary | ICD-10-CM | POA: Insufficient documentation

## 2012-11-21 DIAGNOSIS — Z8742 Personal history of other diseases of the female genital tract: Secondary | ICD-10-CM | POA: Insufficient documentation

## 2012-11-21 DIAGNOSIS — Z8709 Personal history of other diseases of the respiratory system: Secondary | ICD-10-CM | POA: Insufficient documentation

## 2012-11-21 DIAGNOSIS — X500XXA Overexertion from strenuous movement or load, initial encounter: Secondary | ICD-10-CM | POA: Insufficient documentation

## 2012-11-21 MED ORDER — CYCLOBENZAPRINE HCL 5 MG PO TABS: 5.0000 mg | ORAL_TABLET | Freq: Three times a day (TID) | ORAL | Status: DC | PRN

## 2012-11-21 MED ORDER — IBUPROFEN 800 MG PO TABS
800.0000 mg | ORAL_TABLET | Freq: Once | ORAL | Status: AC
  Administered 2012-11-21: 800 mg via ORAL
  Filled 2012-11-21: qty 1

## 2012-11-21 MED ORDER — IBUPROFEN 600 MG PO TABS: 600.0000 mg | ORAL_TABLET | Freq: Three times a day (TID) | ORAL | Status: DC | PRN

## 2012-11-21 MED ORDER — CYCLOBENZAPRINE HCL 10 MG PO TABS
10.0000 mg | ORAL_TABLET | Freq: Once | ORAL | Status: AC
  Administered 2012-11-21: 10 mg via ORAL
  Filled 2012-11-21: qty 1

## 2012-11-21 MED ORDER — HYDROCODONE-ACETAMINOPHEN 5-325 MG PO TABS
1.0000 | ORAL_TABLET | Freq: Once | ORAL | Status: AC
  Administered 2012-11-21: 1 via ORAL
  Filled 2012-11-21: qty 1

## 2012-11-21 MED ORDER — HYDROCODONE-ACETAMINOPHEN 5-325 MG PO TABS: 1.0000 | ORAL_TABLET | ORAL | Status: DC | PRN

## 2012-11-21 NOTE — ED Notes (Signed)
Patient states has been having mid-back pain that radiates down both legs to her feet x 1 week.  Patient describes pain as stabbing pain.
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
## 2012-11-22 NOTE — ED Provider Notes (Signed)
History     CSN: [id number]  Arrival date & time 11/21/12  9629   First MD Initiated Contact with Patient 11/21/12 2114      Chief Complaint  Patient presents with  . Back Pain  . Leg Pain    (Consider location/radiation/quality/duration/timing/severity/associated sxs/prior treatment) HPI Comments: Rebecca Delacruz is a 27 y.o. Female with a one week history of low back pain with radiation of pain into her bilateral lower extremities to her toes.  She reports lifting a heavy box at work the day before her symptoms began.  Pain is sharp and worse with movement and walking,  Better at rest.  She denies weakness and numbness in her legs and has had no urinary or bowel retention or incontinence,  Has had no fevers, dysuria, hematuria or flank pain.  She has no history of cancer and does not use IV drugs.  She has taken mobic, flexeril and tramadol in the past for other episodes of back pain,  She took a mobic and tramadol today with no relief.    The history is provided by the patient.    Past Medical History  Diagnosis Date  . Ovarian cyst   . Gonorrhea   . Trichomonas   . Constipation   . No pertinent past medical history   . Shortness of breath     Past Surgical History  Procedure Laterality Date  . No past surgeries    . Tubal ligation  10/15/2011    Procedure: POST PARTUM TUBAL LIGATION;  Surgeon: Tilda Burrow, MD;  Location: [facility];  Service: Gynecology;  Laterality: Bilateral;  Post partum tubal ligation bilateral with filshie clips    Family History  Problem Relation Age of Onset  . Diabetes Mother   . Anesthesia problems Neg Hx   . Hypotension Neg Hx   . Malignant hyperthermia Neg Hx   . Pseudochol deficiency Neg Hx     History  Substance Use Topics  . Smoking status: Current Every Day Smoker -- 1.00 packs/day for 10 years    Types: Cigarettes  . Smokeless tobacco: Not on file  . Alcohol Use: No    OB History   Grav Para Term Preterm Abortions TAB SAB Ect  Mult Living   5 4 3 1 1     3      Obstetric Comments   Pt reported Abruption as part of del of FDIU, in 2010, but records deny any mention of abruption.  Pt had known FDIU prior to presentation in active labor, delivering promptly upon arrival  Urine Drug screen neg at time of del.      Review of Systems  Constitutional: Negative for fever.  Respiratory: Negative for shortness of breath.   Cardiovascular: Negative for chest pain and leg swelling.  Gastrointestinal: Negative for abdominal pain, constipation and abdominal distention.  Genitourinary: Negative for dysuria, urgency, frequency, flank pain and difficulty urinating.  Musculoskeletal: Positive for back pain. Negative for joint swelling and gait problem.  Skin: Negative for rash.  Neurological: Negative for weakness and numbness.    Allergies  Review of patient's allergies indicates no known allergies.  Home Medications   Current Outpatient Rx  Name  Route  Sig  Dispense  Refill  . cyclobenzaprine (FLEXERIL) 10 MG tablet   Oral   Take 10 mg by mouth 2 (two) times daily as needed. Muscle spasm         . meloxicam (MOBIC) 15 MG tablet   Oral  Take 15 mg by mouth daily.         Marland Kitchen oxyCODONE-acetaminophen (PERCOCET) 10-325 MG per tablet   Oral   Take 1 tablet by mouth every 4 (four) hours as needed. pain         . traMADol (ULTRAM) 50 MG tablet   Oral   Take 50 mg by mouth every 6 (six) hours as needed for pain.         Marland Kitchen EXPIRED: albuterol (PROVENTIL HFA;VENTOLIN HFA) 108 (90 BASE) MCG/ACT inhaler   Inhalation   Inhale 2 puffs into the lungs every 4 (four) hours as needed for wheezing or shortness of breath.   1 Inhaler   1   . cyclobenzaprine (FLEXERIL) 5 MG tablet   Oral   Take 1 tablet (5 mg total) by mouth 3 (three) times daily as needed for muscle spasms.   15 tablet   0   . HYDROcodone-acetaminophen (NORCO/VICODIN) 5-325 MG per tablet   Oral   Take 1 tablet by mouth every 4 (four) hours as  needed for pain.   15 tablet   0   . ibuprofen (ADVIL,MOTRIN) 600 MG tablet   Oral   Take 1 tablet (600 mg total) by mouth every 8 (eight) hours as needed for pain.   15 tablet   0     BP 129/74  Pulse 91  Temp(Src) 98.3 F (36.8 C) (Oral)  Resp 18  Ht 5\' 2"  (1.575 m)  Wt 180 lb (81.647 kg)  BMI 32.91 kg/m2  SpO2 100%  LMP 10/24/2012  Physical Exam  Nursing note and vitals reviewed. Constitutional: She appears well-developed and well-nourished.  HENT:  Head: Normocephalic.  Eyes: Conjunctivae are normal.  Neck: Normal range of motion. Neck supple.  Cardiovascular: Normal rate and intact distal pulses.   Pedal pulses normal.  Pulmonary/Chest: Effort normal.  Abdominal: Soft. Bowel sounds are normal. She exhibits no distension and no mass.  Musculoskeletal: Normal range of motion. She exhibits tenderness. She exhibits no edema.       Lumbar back: She exhibits tenderness. She exhibits no bony tenderness, no swelling, no edema and no spasm.  Paralumbar ttp,  No midline pain.  Neurological: She is alert. She has normal strength. She displays no atrophy and no tremor. No sensory deficit. Gait normal.  Reflex Scores:      Patellar reflexes are 2+ on the right side and 2+ on the left side.      Achilles reflexes are 2+ on the right side and 2+ on the left side. No strength deficit noted in hip and knee flexor and extensor muscle groups.  Ankle flexion and extension intact.  Skin: Skin is warm and dry.  Psychiatric: She has a normal mood and affect.    ED Course  Procedures (including critical care time)  Labs Reviewed - No data to display No results found.   1. Lumbar back sprain, initial encounter       MDM  No neuro deficit on exam or by history to suggest emergent or surgical presentation.  Also discussed worsened sx that should prompt immediate re-evaluation including distal weakness, bowel/bladder retention/incontinence. Pt prescribed flexeril, hydrocodone,  ibuprofen.  Rest,  Heat therapy.               Burgess Amor, [place] 11/22/12 [phone]

## 2012-11-22 NOTE — ED Provider Notes (Signed)
Medical screening examination/treatment/procedure(s) were performed by non-physician practitioner and as supervising physician I was immediately available for consultation/collaboration. Devoria Albe, MD, Armando Gang   Ward Givens, MD 11/22/12 2258

## 2012-12-02 ENCOUNTER — Emergency Department (HOSPITAL_COMMUNITY)
Admission: EM | Admit: 2012-12-02 | Discharge: 2012-12-02 | Disposition: A | Discharge status: Home / Self Care | Payer: Medicaid Other | Attending: Emergency Medicine | Admitting: Emergency Medicine

## 2012-12-02 ENCOUNTER — Encounter (HOSPITAL_COMMUNITY): Payer: Self-pay

## 2012-12-02 DIAGNOSIS — Z3202 Encounter for pregnancy test, result negative: Secondary | ICD-10-CM | POA: Insufficient documentation

## 2012-12-02 DIAGNOSIS — Z79899 Other long term (current) drug therapy: Secondary | ICD-10-CM | POA: Insufficient documentation

## 2012-12-02 DIAGNOSIS — Z8709 Personal history of other diseases of the respiratory system: Secondary | ICD-10-CM | POA: Insufficient documentation

## 2012-12-02 DIAGNOSIS — J3489 Other specified disorders of nose and nasal sinuses: Secondary | ICD-10-CM | POA: Insufficient documentation

## 2012-12-02 DIAGNOSIS — F17200 Nicotine dependence, unspecified, uncomplicated: Secondary | ICD-10-CM | POA: Insufficient documentation

## 2012-12-02 DIAGNOSIS — J029 Acute pharyngitis, unspecified: Secondary | ICD-10-CM | POA: Insufficient documentation

## 2012-12-02 DIAGNOSIS — Z8719 Personal history of other diseases of the digestive system: Secondary | ICD-10-CM | POA: Insufficient documentation

## 2012-12-02 DIAGNOSIS — Z8742 Personal history of other diseases of the female genital tract: Secondary | ICD-10-CM | POA: Insufficient documentation

## 2012-12-02 DIAGNOSIS — R350 Frequency of micturition: Secondary | ICD-10-CM | POA: Insufficient documentation

## 2012-12-02 DIAGNOSIS — R300 Dysuria: Secondary | ICD-10-CM | POA: Insufficient documentation

## 2012-12-02 DIAGNOSIS — N390 Urinary tract infection, site not specified: Secondary | ICD-10-CM | POA: Insufficient documentation

## 2012-12-02 DIAGNOSIS — Z8619 Personal history of other infectious and parasitic diseases: Secondary | ICD-10-CM | POA: Insufficient documentation

## 2012-12-02 DIAGNOSIS — R059 Cough, unspecified: Secondary | ICD-10-CM | POA: Insufficient documentation

## 2012-12-02 DIAGNOSIS — J069 Acute upper respiratory infection, unspecified: Secondary | ICD-10-CM | POA: Insufficient documentation

## 2012-12-02 LAB — URINALYSIS, ROUTINE W REFLEX MICROSCOPIC
Bilirubin Urine: NEGATIVE
Ketones, ur: NEGATIVE mg/dL
Nitrite: NEGATIVE
Protein, ur: NEGATIVE mg/dL
pH: 7 (ref 5.0–8.0)

## 2012-12-02 LAB — PREGNANCY, URINE: Preg Test, Ur: NEGATIVE

## 2012-12-02 LAB — RAPID URINE DRUG SCREEN, HOSP PERFORMED
Barbiturates: NOT DETECTED
Cocaine: POSITIVE — AB
Tetrahydrocannabinol: NOT DETECTED

## 2012-12-02 LAB — URINE MICROSCOPIC-ADD ON

## 2012-12-02 MED ORDER — SULFAMETHOXAZOLE-TRIMETHOPRIM 800-160 MG PO TABS: 1.0000 | ORAL_TABLET | Freq: Two times a day (BID) | ORAL | Status: DC

## 2012-12-02 NOTE — ED Notes (Signed)
Patient presenting with chills that began early this morning. Patient says she has no fever but has been thirsty and has the "shakes".
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
## 2012-12-02 NOTE — ED Provider Notes (Signed)
History     CSN: [id number]  Arrival date & time 12/02/12  1616   First MD Initiated Contact with Patient 12/02/12 1651      Chief Complaint  Patient presents with  . Chills    (Consider location/radiation/quality/duration/timing/severity/associated sxs/prior treatment) HPI Comments: Patient comes to the ER for evaluation of upper respiratory infection symptoms with chills. Patient reports sinus congestion, sore throat and cough. She says she felt like she had chills earlier, has not taken her temperature. There is no shortness of breath. Patient denies nausea, vomiting, diarrhea and abdominal pain. She does report that she has had increased urinary frequency with dysuria.   Past Medical History  Diagnosis Date  . Ovarian cyst   . Gonorrhea   . Trichomonas   . Constipation   . No pertinent past medical history   . Shortness of breath     Past Surgical History  Procedure Laterality Date  . No past surgeries    . Tubal ligation  10/15/2011    Procedure: POST PARTUM TUBAL LIGATION;  Surgeon: Tilda Burrow, MD;  Location: [facility];  Service: Gynecology;  Laterality: Bilateral;  Post partum tubal ligation bilateral with filshie clips    Family History  Problem Relation Age of Onset  . Diabetes Mother   . Anesthesia problems Neg Hx   . Hypotension Neg Hx   . Malignant hyperthermia Neg Hx   . Pseudochol deficiency Neg Hx     History  Substance Use Topics  . Smoking status: Current Every Day Smoker -- 1.00 packs/day for 10 years    Types: Cigarettes  . Smokeless tobacco: Not on file  . Alcohol Use: No    OB History   Grav Para Term Preterm Abortions TAB SAB Ect Mult Living   5 4 3 1 1     3      Obstetric Comments   Pt reported Abruption as part of del of FDIU, in 2010, but records deny any mention of abruption.  Pt had known FDIU prior to presentation in active labor, delivering promptly upon arrival  Urine Drug screen neg at time of del.      Review of Systems   Constitutional: Positive for chills.  HENT: Positive for congestion and sore throat.   Respiratory: Positive for cough.   All other systems reviewed and are negative.    Allergies  Review of patient's allergies indicates no known allergies.  Home Medications   Current Outpatient Rx  Name  Route  Sig  Dispense  Refill  . cyclobenzaprine (FLEXERIL) 5 MG tablet   Oral   Take 1 tablet (5 mg total) by mouth 3 (three) times daily as needed for muscle spasms.   15 tablet   0   . ibuprofen (ADVIL,MOTRIN) 600 MG tablet   Oral   Take 1 tablet (600 mg total) by mouth every 8 (eight) hours as needed for pain.   15 tablet   0   . meloxicam (MOBIC) 15 MG tablet   Oral   Take 15 mg by mouth daily.         Marland Kitchen EXPIRED: albuterol (PROVENTIL HFA;VENTOLIN HFA) 108 (90 BASE) MCG/ACT inhaler   Inhalation   Inhale 2 puffs into the lungs every 4 (four) hours as needed for wheezing or shortness of breath.   1 Inhaler   1   . traMADol (ULTRAM) 50 MG tablet   Oral   Take 50 mg by mouth every 6 (six) hours as needed for pain.  BP 134/72  Pulse 86  Temp(Src) 97.6 F (36.4 C) (Oral)  Resp 24  Ht 5\' 1"  (1.549 m)  Wt 185 lb (83.915 kg)  BMI 34.97 kg/m2  SpO2 98%  LMP 11/23/2012  Physical Exam  Constitutional: She is oriented to person, place, and time. She appears well-developed and well-nourished. No distress.  HENT:  Head: Normocephalic and atraumatic.  Right Ear: Hearing normal.  Nose: Nose normal.  Mouth/Throat: Oropharynx is clear and moist and mucous membranes are normal.  Eyes: Conjunctivae and EOM are normal. Pupils are equal, round, and reactive to light.  Neck: Normal range of motion. Neck supple.  Cardiovascular: Normal rate, regular rhythm, S1 normal and S2 normal.  Exam reveals no gallop and no friction rub.   No murmur heard. Pulmonary/Chest: Effort normal and breath sounds normal. No respiratory distress. She exhibits no tenderness.  Abdominal: Soft.  Normal appearance and bowel sounds are normal. There is no hepatosplenomegaly. There is no tenderness. There is no rebound, no guarding, no tenderness at McBurney's point and negative Murphy's sign. No hernia.  Musculoskeletal: Normal range of motion.  Neurological: She is alert and oriented to person, place, and time. She has normal strength. No cranial nerve deficit or sensory deficit. Coordination normal. GCS eye subscore is 4. GCS verbal subscore is 5. GCS motor subscore is 6.  Skin: Skin is warm, dry and intact. No rash noted. No cyanosis.  Psychiatric: She has a normal mood and affect. Her speech is normal and behavior is normal. Thought content normal.    ED Course  Procedures (including critical care time)  Labs Reviewed  URINALYSIS, ROUTINE W REFLEX MICROSCOPIC - Abnormal; Notable for the following:    APPearance HAZY (*)    Hgb urine dipstick MODERATE (*)    All other components within normal limits  URINE RAPID DRUG SCREEN (HOSP PERFORMED) - Abnormal; Notable for the following:    Cocaine POSITIVE (*)    All other components within normal limits  URINE MICROSCOPIC-ADD ON - Abnormal; Notable for the following:    Squamous Epithelial / LPF MANY (*)    Bacteria, UA FEW (*)    All other components within normal limits  RAPID STREP SCREEN  URINE CULTURE  PREGNANCY, URINE   No results found.   Diagnoses: 1. Upper respiratory infection 2. UTI    MDM  Patient presents to the ER with upper respiratory infection symptoms. She reports chills earlier but did not take her temperature. She is afebrile here in the ER. Strep was negative. Remainder of the examination was unremarkable. Patient does report increased urinary frequency and dysuria, urinalysis equivocal, but based on symptomatology will treat.        Gilda Crease, MD 12/02/12 [phone]

## 2012-12-04 LAB — URINE CULTURE: Colony Count: NO GROWTH

## 2012-12-29 ENCOUNTER — Encounter (HOSPITAL_COMMUNITY): Payer: Self-pay | Admitting: *Deleted

## 2012-12-29 ENCOUNTER — Emergency Department (HOSPITAL_COMMUNITY): Payer: Medicaid Other

## 2012-12-29 ENCOUNTER — Emergency Department (HOSPITAL_COMMUNITY)
Admission: EM | Admit: 2012-12-29 | Discharge: 2012-12-29 | Disposition: A | Discharge status: Home / Self Care | Payer: Medicaid Other | Attending: Emergency Medicine | Admitting: Emergency Medicine

## 2012-12-29 DIAGNOSIS — M545 Low back pain, unspecified: Secondary | ICD-10-CM | POA: Insufficient documentation

## 2012-12-29 DIAGNOSIS — F17200 Nicotine dependence, unspecified, uncomplicated: Secondary | ICD-10-CM | POA: Insufficient documentation

## 2012-12-29 DIAGNOSIS — J45901 Unspecified asthma with (acute) exacerbation: Secondary | ICD-10-CM | POA: Insufficient documentation

## 2012-12-29 DIAGNOSIS — R51 Headache: Secondary | ICD-10-CM | POA: Insufficient documentation

## 2012-12-29 DIAGNOSIS — R059 Cough, unspecified: Secondary | ICD-10-CM | POA: Insufficient documentation

## 2012-12-29 DIAGNOSIS — Z8719 Personal history of other diseases of the digestive system: Secondary | ICD-10-CM | POA: Insufficient documentation

## 2012-12-29 DIAGNOSIS — Z8619 Personal history of other infectious and parasitic diseases: Secondary | ICD-10-CM | POA: Insufficient documentation

## 2012-12-29 DIAGNOSIS — R05 Cough: Secondary | ICD-10-CM | POA: Insufficient documentation

## 2012-12-29 DIAGNOSIS — Z79899 Other long term (current) drug therapy: Secondary | ICD-10-CM | POA: Insufficient documentation

## 2012-12-29 DIAGNOSIS — Z8742 Personal history of other diseases of the female genital tract: Secondary | ICD-10-CM | POA: Insufficient documentation

## 2012-12-29 DIAGNOSIS — R093 Abnormal sputum: Secondary | ICD-10-CM | POA: Insufficient documentation

## 2012-12-29 DIAGNOSIS — R509 Fever, unspecified: Secondary | ICD-10-CM | POA: Insufficient documentation

## 2012-12-29 DIAGNOSIS — J40 Bronchitis, not specified as acute or chronic: Secondary | ICD-10-CM

## 2012-12-29 DIAGNOSIS — IMO0001 Reserved for inherently not codable concepts without codable children: Secondary | ICD-10-CM | POA: Insufficient documentation

## 2012-12-29 DIAGNOSIS — Z791 Long term (current) use of non-steroidal anti-inflammatories (NSAID): Secondary | ICD-10-CM | POA: Insufficient documentation

## 2012-12-29 MED ORDER — PREDNISONE 50 MG PO TABS
60.0000 mg | ORAL_TABLET | Freq: Once | ORAL | Status: AC
  Administered 2012-12-29: 60 mg via ORAL
  Filled 2012-12-29: qty 1

## 2012-12-29 MED ORDER — ALBUTEROL SULFATE (5 MG/ML) 0.5% IN NEBU
5.0000 mg | INHALATION_SOLUTION | Freq: Once | RESPIRATORY_TRACT | Status: AC
  Administered 2012-12-29: 5 mg via RESPIRATORY_TRACT
  Filled 2012-12-29: qty 1

## 2012-12-29 MED ORDER — IPRATROPIUM BROMIDE 0.02 % IN SOLN
0.5000 mg | Freq: Once | RESPIRATORY_TRACT | Status: AC
  Administered 2012-12-29: 0.5 mg via RESPIRATORY_TRACT
  Filled 2012-12-29: qty 2.5

## 2012-12-29 MED ORDER — GUAIFENESIN-CODEINE 100-10 MG/5ML PO SOLN
10.0000 mL | Freq: Once | ORAL | Status: AC
  Administered 2012-12-29: 10 mL via ORAL
  Filled 2012-12-29: qty 10

## 2012-12-29 MED ORDER — PREDNISONE 20 MG PO TABS: 40.0000 mg | ORAL_TABLET | Freq: Every day | ORAL | Status: DC

## 2012-12-29 MED ORDER — ALBUTEROL SULFATE HFA 108 (90 BASE) MCG/ACT IN AERS
2.0000 | INHALATION_SPRAY | Freq: Once | RESPIRATORY_TRACT | Status: AC
  Administered 2012-12-29: 2 via RESPIRATORY_TRACT
  Filled 2012-12-29: qty 6.7

## 2012-12-29 NOTE — ED Provider Notes (Signed)
History  This chart was scribed for Rebecca Razor, MD by Shari Heritage, ED Scribe. The patient was seen in room APA01/APA01. Patient's care was started at 1741.   CSN: [id number]  Arrival date & time 12/29/12  1647   First MD Initiated Contact with Patient 12/29/12 1741      Chief Complaint  Patient presents with  . Chills  . Nasal Congestion     Patient is a 27 y.o. female presenting with URI. The history is provided by the patient. No language interpreter was used.  URI Presenting symptoms: congestion, cough and fever   Cough:    Cough characteristics:  Productive   Sputum characteristics:  Yellow   Severity:  Moderate   Onset quality:  Gradual   Timing:  Intermittent   Progression:  Unchanged   Chronicity:  New Fever:    Temp source:  Subjective Severity:  Moderate Onset quality:  Gradual Duration:  5 days Timing:  Constant Progression:  Unchanged Chronicity:  New Relieved by:  Inhaler Ineffective treatments:  Decongestant Associated symptoms: headaches and myalgias     HPI Comments: Rebecca Delacruz is a 27 y.o. female with history of asthma who presents to the Emergency Department complaining of chills, nasal congestion, productive cough and orthopnea for the past 4-5 days. There is associated lower back pain, body aches and headaches. Cough is productive of yellow sputum. Patient denies abdominal pain, nausea or vomiting. Patient states that she has taken Mucinex, Robitussin and Nyquil without relief. She states that using her inhaler improves breathing and cough mildly. She denies any other pertinent medical history. Patient is a current every day smoker   Past Medical History  Diagnosis Date  . Ovarian cyst   . Gonorrhea   . Trichomonas   . Constipation   . No pertinent past medical history   . Shortness of breath     Past Surgical History  Procedure Laterality Date  . No past surgeries    . Tubal ligation  10/15/2011    Procedure: POST PARTUM TUBAL  LIGATION;  Surgeon: Tilda Burrow, MD;  Location: [facility];  Service: Gynecology;  Laterality: Bilateral;  Post partum tubal ligation bilateral with filshie clips    Family History  Problem Relation Age of Onset  . Diabetes Mother   . Anesthesia problems Neg Hx   . Hypotension Neg Hx   . Malignant hyperthermia Neg Hx   . Pseudochol deficiency Neg Hx     History  Substance Use Topics  . Smoking status: Current Every Day Smoker -- 1.00 packs/day for 10 years    Types: Cigarettes  . Smokeless tobacco: Not on file  . Alcohol Use: No    OB History   Grav Para Term Preterm Abortions TAB SAB Ect Mult Living   5 4 3 1 1     3      Obstetric Comments   Pt reported Abruption as part of del of FDIU, in 2010, but records deny any mention of abruption.  Pt had known FDIU prior to presentation in active labor, delivering promptly upon arrival  Urine Drug screen neg at time of del.      Review of Systems  Constitutional: Positive for fever.  HENT: Positive for congestion.   Respiratory: Positive for cough.   Gastrointestinal: Negative for nausea, vomiting and abdominal pain.  Musculoskeletal: Positive for myalgias and back pain.  Neurological: Positive for headaches.  All other systems reviewed and are negative.    Allergies  Review  of patient's allergies indicates no known allergies.  Home Medications   Current Outpatient Rx  Name  Route  Sig  Dispense  Refill  . EXPIRED: albuterol (PROVENTIL HFA;VENTOLIN HFA) 108 (90 BASE) MCG/ACT inhaler   Inhalation   Inhale 2 puffs into the lungs every 4 (four) hours as needed for wheezing or shortness of breath.   1 Inhaler   1   . cyclobenzaprine (FLEXERIL) 5 MG tablet   Oral   Take 1 tablet (5 mg total) by mouth 3 (three) times daily as needed for muscle spasms.   15 tablet   0   . ibuprofen (ADVIL,MOTRIN) 600 MG tablet   Oral   Take 1 tablet (600 mg total) by mouth every 8 (eight) hours as needed for pain.   15 tablet   0   .  meloxicam (MOBIC) 15 MG tablet   Oral   Take 15 mg by mouth daily.         Marland Kitchen sulfamethoxazole-trimethoprim (SEPTRA DS) 800-160 MG per tablet   Oral   Take 1 tablet by mouth every 12 (twelve) hours.   10 tablet   0   . traMADol (ULTRAM) 50 MG tablet   Oral   Take 50 mg by mouth every 6 (six) hours as needed for pain.           Triage Vitals: BP 110/69  Pulse 79  Temp(Src) 98.3 F (36.8 C) (Oral)  Resp 17  Ht 5\' 1"  (1.549 m)  Wt 175 lb (79.379 kg)  BMI 33.08 kg/m2  SpO2 100%  LMP 12/29/2012  Physical Exam  Nursing note and vitals reviewed. Constitutional: She appears well-developed and well-nourished. No distress.  HENT:  Head: Normocephalic and atraumatic.  Eyes: Conjunctivae are normal. Right eye exhibits no discharge. Left eye exhibits no discharge.  Neck: Neck supple.  Cardiovascular: Normal rate, regular rhythm and normal heart sounds.  Exam reveals no gallop and no friction rub.   No murmur heard. Pulmonary/Chest: Effort normal and breath sounds normal. No respiratory distress. She has no wheezes. She has no rhonchi. She has no rales.  Abdominal: Soft. She exhibits no distension. There is no tenderness.  Musculoskeletal: She exhibits no edema and no tenderness.  Neurological: She is alert.  Skin: Skin is warm and dry.  Psychiatric: She has a normal mood and affect. Her behavior is normal. Thought content normal.    ED Course  Procedures (including critical care time) DIAGNOSTIC STUDIES: Oxygen Saturation is 100% on room air, normal by my interpretation.    COORDINATION OF CARE: 6:14 PM- Patient informed of current plan for treatment and evaluation and agrees with plan at this time.   Dg Chest 2 View  12/29/2012  *RADIOLOGY REPORT*  Clinical Data: Cough, fever  CHEST - 2 VIEW  Comparison: 09/09/2012  Findings: Upper normal heart size. Mildly decreased lung volumes. Mediastinal contours and pulmonary vascularity normal. Lungs clear. No pleural effusion or  pneumothorax. Bones unremarkable.  IMPRESSION: No acute abnormalities.   Original Report Authenticated By: Ulyses Southward, M.D.      1. Bronchitis       MDM  26yf with cough. Suspect viral bronchitis. No respiratory distress. Reassuring exam and imaging.    I personally preformed the services scribed in my presence. The recorded information has been reviewed is accurate. Rebecca Razor, MD.       Rebecca Razor, MD 01/02/13 [phone]

## 2012-12-29 NOTE — ED Notes (Addendum)
Pt c/o chills, nasal congestion, states that it is hard for her to breath at night laying down, has ran out of her inhaler, admits to cough that is productive with yellow sputum production, n/v X6, lower back pain.

## 2013-01-22 ENCOUNTER — Encounter (HOSPITAL_COMMUNITY): Payer: Self-pay | Admitting: Emergency Medicine

## 2013-01-22 ENCOUNTER — Emergency Department (HOSPITAL_COMMUNITY)
Admission: EM | Admit: 2013-01-22 | Discharge: 2013-01-22 | Disposition: A | Discharge status: Home / Self Care | Payer: Medicaid Other | Attending: Emergency Medicine | Admitting: Emergency Medicine

## 2013-01-22 DIAGNOSIS — L02411 Cutaneous abscess of right axilla: Secondary | ICD-10-CM

## 2013-01-22 DIAGNOSIS — IMO0002 Reserved for concepts with insufficient information to code with codable children: Secondary | ICD-10-CM | POA: Insufficient documentation

## 2013-01-22 DIAGNOSIS — F17200 Nicotine dependence, unspecified, uncomplicated: Secondary | ICD-10-CM | POA: Insufficient documentation

## 2013-01-22 DIAGNOSIS — Z8742 Personal history of other diseases of the female genital tract: Secondary | ICD-10-CM | POA: Insufficient documentation

## 2013-01-22 DIAGNOSIS — Z8619 Personal history of other infectious and parasitic diseases: Secondary | ICD-10-CM | POA: Insufficient documentation

## 2013-01-22 DIAGNOSIS — Z8719 Personal history of other diseases of the digestive system: Secondary | ICD-10-CM | POA: Insufficient documentation

## 2013-01-22 MED ORDER — BENZOCAINE 20 % MT SOLN
OROMUCOSAL | Status: AC
  Filled 2013-01-22: qty 57

## 2013-01-22 MED ORDER — OXYCODONE-ACETAMINOPHEN 5-325 MG PO TABS
1.0000 | ORAL_TABLET | Freq: Once | ORAL | Status: AC
  Administered 2013-01-22: 1 via ORAL
  Filled 2013-01-22: qty 1

## 2013-01-22 MED ORDER — LIDOCAINE HCL 2 % EX GEL
Freq: Once | CUTANEOUS | Status: AC
  Administered 2013-01-22: 10 via TOPICAL
  Filled 2013-01-22: qty 10

## 2013-01-22 MED ORDER — LIDOCAINE HCL (PF) 1 % IJ SOLN
5.0000 mL | Freq: Once | INTRAMUSCULAR | Status: DC
  Filled 2013-01-22: qty 5

## 2013-01-22 MED ORDER — HYDROCODONE-ACETAMINOPHEN 5-325 MG PO TABS: 1.0000 | ORAL_TABLET | ORAL | Status: DC | PRN

## 2013-01-22 MED ORDER — SULFAMETHOXAZOLE-TRIMETHOPRIM 800-160 MG PO TABS: 1.0000 | ORAL_TABLET | Freq: Two times a day (BID) | ORAL | Status: DC

## 2013-01-22 MED ORDER — KETOROLAC TROMETHAMINE 60 MG/2ML IM SOLN
60.0000 mg | Freq: Once | INTRAMUSCULAR | Status: AC
  Administered 2013-01-22: 60 mg via INTRAMUSCULAR
  Filled 2013-01-22: qty 2

## 2013-01-22 NOTE — ED Notes (Signed)
Abscess to right under arm. Pt also states she has some spots under breast.

## 2013-01-22 NOTE — ED Provider Notes (Signed)
History     CSN: [id number]  Arrival date & time 01/22/13  0750   First MD Initiated Contact with Patient 01/22/13 0805      Chief Complaint  Patient presents with  . Abscess    (Consider location/radiation/quality/duration/timing/severity/associated sxs/prior treatment) Patient is a 27 y.o. female presenting with abscess. The history is provided by the patient.  Abscess Location:  Shoulder/arm Shoulder/arm abscess location:  R axilla Abscess quality: fluctuance, painful and warmth   Abscess quality: not draining   Red streaking: no   Duration:  7 days Pain details:    Quality:  Sharp and throbbing   Severity:  Severe   Duration:  5 days   Timing:  Constant   Progression:  Worsening Chronicity:  New Relieved by:  Nothing Worsened by:  Draining/squeezing Ineffective treatments:  Draining/squeezing Associated symptoms: no fever, no headaches, no nausea and no vomiting   Risk factors: prior abscess    Rebecca Delacruz is a 27 y.o. female who presents to the ED with pain in the right axilla . She states that she thinks she has an abscess. She has had abscesses before and had one drained. She is frightened because the last time it was so painful to drain. She denies fever or chills. She rates the pain as 9/10. Past Medical History  Diagnosis Date  . Ovarian cyst   . Gonorrhea   . Trichomonas   . Constipation   . No pertinent past medical history   . Shortness of breath     Past Surgical History  Procedure Laterality Date  . No past surgeries    . Tubal ligation  10/15/2011    Procedure: POST PARTUM TUBAL LIGATION;  Surgeon: Tilda Burrow, MD;  Location: [facility];  Service: Gynecology;  Laterality: Bilateral;  Post partum tubal ligation bilateral with filshie clips    Family History  Problem Relation Age of Onset  . Diabetes Mother   . Anesthesia problems Neg Hx   . Hypotension Neg Hx   . Malignant hyperthermia Neg Hx   . Pseudochol deficiency Neg Hx     History   Substance Use Topics  . Smoking status: Current Every Day Smoker -- 1.00 packs/day for 10 years    Types: Cigarettes  . Smokeless tobacco: Not on file  . Alcohol Use: No    OB History   Grav Para Term Preterm Abortions TAB SAB Ect Mult Living   5 4 3 1 1     3      Obstetric Comments   Pt reported Abruption as part of del of FDIU, in 2010, but records deny any mention of abruption.  Pt had known FDIU prior to presentation in active labor, delivering promptly upon arrival  Urine Drug screen neg at time of del.      Review of Systems  Constitutional: Negative for fever, chills and activity change.  HENT: Negative.   Respiratory: Negative for cough and shortness of breath.   Cardiovascular: Negative for chest pain.  Gastrointestinal: Negative for nausea and vomiting.  Skin: Positive for wound.  Neurological: Negative for headaches.  Psychiatric/Behavioral: The patient is not nervous/anxious.     Allergies  Review of patient's allergies indicates no known allergies.  Home Medications   Current Outpatient Rx  Name  Route  Sig  Dispense  Refill  . HYDROcodone-acetaminophen (NORCO/VICODIN) 5-325 MG per tablet   Oral   Take 1 tablet by mouth every 4 (four) hours as needed.   15 tablet  0   . predniSONE (DELTASONE) 20 MG tablet   Oral   Take 2 tablets (40 mg total) by mouth daily.   10 tablet   0   . sulfamethoxazole-trimethoprim (SEPTRA DS) 800-160 MG per tablet   Oral   Take 1 tablet by mouth 2 (two) times daily.   28 tablet   0     BP 119/63  Temp(Src) 98 F (36.7 C) (Oral)  Resp 20  Ht 5\' 1"  (1.549 m)  Wt 175 lb (79.379 kg)  BMI 33.08 kg/m2  SpO2 96%  LMP 12/29/2012  Physical Exam  Nursing note and vitals reviewed. Constitutional: She is oriented to person, place, and time. No distress.  Morbidly obese   HENT:  Head: Normocephalic.  Eyes: EOM are normal.  Neck: Neck supple.  Pulmonary/Chest: Effort normal.    Raised, tender area right axilla  with erythema. Approximately 3 cm size. There is a fluctuant area in the center with firmness surround the area.   Musculoskeletal: Normal range of motion.  Neurological: She is alert and oriented to person, place, and time. No cranial nerve deficit.  Skin: Skin is warm and dry.  Psychiatric: She has a normal mood and affect. Her behavior is normal. Judgment and thought content normal.    ED Course  Procedures  INCISION AND DRAINAGE Performed by: NEESE,HOPE Consent: Verbal consent obtained. Risks and benefits: risks, benefits and alternatives were discussed Type: abscess  Body area: right axilla  Patient given Hydrocodone PO and Toradol IM prior to procedure  Topical lidocaine applied prior to procedure  Anesthesia: local infiltration  Incision was made with a # 11 scalpel.  Local anesthetic: lidocaine  1% without epinephrine  Anesthetic total: 2 ml  Complexity: complex Blunt dissection to break up loculations  Drainage: purulent  Drainage amount: moderate  Packing material: 1/4 in iodoform gauze  Patient tolerance: Patient tolerated the procedure well with no immediate complications.   Assessment:  1. Abscess of right axilla     Plan: Antibiotics, pain management  Warm wet compresses  Packing removal in 2 days   Follow up with PCP  MDM  Discussed with the patient clinical findings and plan of care. all questioned fully answered. She will return if any problems arise.    Medication List    TAKE these medications       HYDROcodone-acetaminophen 5-325 MG per tablet  Commonly known as:  NORCO/VICODIN  Take 1 tablet by mouth every 4 (four) hours as needed.     sulfamethoxazole-trimethoprim 800-160 MG per tablet  Commonly known as:  SEPTRA DS  Take 1 tablet by mouth 2 (two) times daily.      ASK your doctor about these medications       predniSONE 20 MG tablet  Commonly known as:  DELTASONE  Take 2 tablets (40 mg total) by mouth daily.                [place], [place] 01/22/13 [phone]

## 2013-01-30 NOTE — ED Provider Notes (Signed)
Medical screening examination/treatment/procedure(s) were performed by non-physician practitioner and as supervising physician I was immediately available for consultation/collaboration.   Kaidon Kinker L Victorious Cosio, MD 01/30/13 1608 

## 2013-02-13 ENCOUNTER — Ambulatory Visit: Payer: Self-pay | Admitting: Nurse Practitioner

## 2013-03-21 ENCOUNTER — Encounter: Payer: Self-pay | Admitting: Family Medicine

## 2013-03-21 ENCOUNTER — Ambulatory Visit (INDEPENDENT_AMBULATORY_CARE_PROVIDER_SITE_OTHER): Payer: Medicaid Other | Admitting: Family Medicine

## 2013-03-21 VITALS — BP 104/69 | HR 70 | Temp 99.3°F | Ht 62.0 in | Wt 187.0 lb

## 2013-03-21 DIAGNOSIS — J309 Allergic rhinitis, unspecified: Secondary | ICD-10-CM

## 2013-03-21 DIAGNOSIS — J45901 Unspecified asthma with (acute) exacerbation: Secondary | ICD-10-CM

## 2013-03-21 DIAGNOSIS — J329 Chronic sinusitis, unspecified: Secondary | ICD-10-CM

## 2013-03-21 MED ORDER — METHYLPREDNISOLONE ACETATE 40 MG/ML IJ SUSP
80.0000 mg | Freq: Once | INTRAMUSCULAR | Status: AC
  Administered 2013-03-21: 80 mg via INTRAMUSCULAR

## 2013-03-21 MED ORDER — BUDESONIDE 180 MCG/ACT IN AEPB: 1.0000 | INHALATION_SPRAY | Freq: Two times a day (BID) | RESPIRATORY_TRACT | Status: DC

## 2013-03-21 MED ORDER — FLUTICASONE PROPIONATE 50 MCG/ACT NA SUSP: 2.0000 | Freq: Every day | NASAL | Status: DC

## 2013-03-21 MED ORDER — AMOXICILLIN-POT CLAVULANATE 875-125 MG PO TABS: 1.0000 | ORAL_TABLET | Freq: Two times a day (BID) | ORAL | Status: DC

## 2013-03-21 MED ORDER — CETIRIZINE HCL 10 MG PO TABS: 10.0000 mg | ORAL_TABLET | Freq: Every day | ORAL | Status: DC

## 2013-03-21 NOTE — Patient Instructions (Signed)
Methylprednisolone Suspension for Injection What is this medicine? METHYLPREDNISOLONE (meth ill pred NISS oh lone) is a corticosteroid. It is commonly used to treat inflammation of the skin, joints, lungs, and other organs. Common conditions treated include asthma, allergies, and arthritis. It is also used for other conditions, such as blood disorders and diseases of the adrenal glands. This medicine may be used for other purposes; ask your health care provider or pharmacist if you have questions. What should I tell my health care provider before I take this medicine? They need to know if you have any of these conditions: -cataracts or glaucoma -Cushings -heart disease -high blood pressure -infection including tuberculosis -low calcium or potassium levels in the blood -recent surgery -seizures -stomach or intestinal disease, including colitis -threadworms -thyroid problems -an unusual or allergic reaction to methylprednisolone, corticosteroids, benzyl alcohol, other medicines, foods, dyes, or preservatives -pregnant or trying to get pregnant -breast-feeding How should I use this medicine? This medicine is for injection into a muscle, joint, or other tissue. It is given by a health care professional in a hospital or clinic setting. Talk to your pediatrician regarding the use of this medicine in children. While this drug may be prescribed for selected conditions, precautions do apply. Overdosage: If you think you have taken too much of this medicine contact a poison control center or emergency room at once. NOTE: This medicine is only for you. Do not share this medicine with others. What if I miss a dose? This does not apply. What may interact with this medicine? Do not take this medicine with any of the following medications: -mifepristone -radiopaque contrast agents This medicine may also interact with the following medications: -aspirin and aspirin-like  medicines -cyclosporin -ketoconazole -phenobarbital -phenytoin -rifampin -tacrolimus -troleandomycin -vaccines -warfarin This list may not describe all possible interactions. Give your health care provider a list of all the medicines, herbs, non-prescription drugs, or dietary supplements you use. Also tell them if you smoke, drink alcohol, or use illegal drugs. Some items may interact with your medicine. What should I watch for while using this medicine? Visit your doctor or health care professional for regular checks on your progress. If you are taking this medicine for a long time, carry an identification card with your name and address, the type and dose of your medicine, and your doctor's name and address. The medicine may increase your risk of getting an infection. Stay away from people who are sick. Tell your doctor or health care professional if you are around anyone with measles or chickenpox. You may need to avoid some vaccines. Talk to your health care provider for more information. If you are going to have surgery, tell your doctor or health care professional that you have taken this medicine within the last twelve months. Ask your doctor or health care professional about your diet. You may need to lower the amount of salt you eat. The medicine can increase your blood sugar. If you are a diabetic check with your doctor if you need help adjusting the dose of your diabetic medicine. What side effects may I notice from receiving this medicine? Side effects that you should report to your doctor or health care professional as soon as possible: -allergic reactions like skin rash, itching or hives, swelling of the face, lips, or tongue -bloody or tarry stools -changes in vision -eye pain or bulging eyes -fever, sore throat, sneezing, cough, or other signs of infection, wounds that will not heal -increased thirst -irregular heartbeat -muscle cramps -pain   in hips, back, ribs, arms,  shoulders, or legs -swelling of the ankles, feet, hands -trouble passing urine or change in the amount of urine -unusual bleeding or bruising -unusually weak or tired -weight gain or weight loss Side effects that usually do not require medical attention (report to your doctor or health care professional if they continue or are bothersome): -changes in emotions or moods -constipation or diarrhea -headache -irritation at site where injected -nausea, vomiting -skin problems, acne, thin and shiny skin -trouble sleeping -unusual hair growth on the face or body This list may not describe all possible side effects. Call your doctor for medical advice about side effects. You may report side effects to FDA at 1-800-FDA-1088. Where should I keep my medicine? This drug is given in a hospital or clinic and will not be stored at home. NOTE: This sheet is a summary. It may not cover all possible information. If you have questions about this medicine, talk to your doctor, pharmacist, or health care provider.  2013, [employer]. (04/09/2008 2:36:31 PM)  

## 2013-03-21 NOTE — Progress Notes (Signed)
Patient ID: Rebecca Delacruz, female   DOB: 1985/11/08, 27 y.o.   MRN: [id number]  HPI:  SINUSITIS Onset:  2-3 months  Location: bilateral maxillary sinuses  Description:rhinorrhea, nasal congestion, bilateral sinus pressure, cough, SOB wheezing   Modifying factors: 1/2 PPD smoker, baseline asthma, recent ER visit for asthma flare   Symptoms Cough:  yes Discharge:  yes Fever: no Sinus Pressure:  yes Ears Blocked:  no Teeth Ache:  no Frontal Headache:  no Second Sickening:  no  Red Flags Change in mental state: no Change in vision: no     Patient Active Problem List   Diagnosis Date Noted  . Sterilization, postpartum 10/15/2011   Past Medical History: Past Medical History  Diagnosis Date  . Ovarian cyst   . Gonorrhea   . Trichomonas   . Constipation   . No pertinent past medical history   . Shortness of breath   . Back pain   . Asthma     Past Surgical History: Past Surgical History  Procedure Laterality Date  . No past surgeries    . Tubal ligation  10/15/2011    Procedure: POST PARTUM TUBAL LIGATION;  Surgeon: Tilda Burrow, MD;  Location: [facility];  Service: Gynecology;  Laterality: Bilateral;  Post partum tubal ligation bilateral with filshie clips    Social History: History   Social History  . Marital Status: Single    Spouse Name: N/A    Number of Children: N/A  . Years of Education: N/A   Social History Main Topics  . Smoking status: Current Every Day Smoker -- 1.00 packs/day for 10 years    Types: Cigarettes  . Smokeless tobacco: None  . Alcohol Use: No  . Drug Use: No     Comment: 09/09/12  last use around first of nov  . Sexually Active: Yes    Birth Control/ Protection: Surgical   Other Topics Concern  . None   Social History Narrative   ** Merged History Encounter **        Family History: Family History  Problem Relation Age of Onset  . Diabetes Mother   . Anesthesia problems Neg Hx   . Hypotension Neg Hx   . Malignant  hyperthermia Neg Hx   . Pseudochol deficiency Neg Hx     Allergies: No Known Allergies  Current Outpatient Prescriptions  Medication Sig Dispense Refill  . albuterol (PROVENTIL HFA;VENTOLIN HFA) 108 (90 BASE) MCG/ACT inhaler Inhale 2 puffs into the lungs every 6 (six) hours as needed for wheezing.      Marland Kitchen amoxicillin-clavulanate (AUGMENTIN) 875-125 MG per tablet Take 1 tablet by mouth 2 (two) times daily.  20 tablet  0  . budesonide (PULMICORT) 180 MCG/ACT inhaler Inhale 1 puff into the lungs 2 (two) times daily.  1 each  6  . cetirizine (ZYRTEC) 10 MG tablet Take 1 tablet (10 mg total) by mouth daily.  30 tablet  6   Current Facility-Administered Medications  Medication Dose Route Frequency Provider Last Rate Last Dose  . fluticasone (FLONASE) 50 MCG/ACT nasal spray 2 spray  2 spray Each Nare Daily Doree Albee, MD      . methylPREDNISolone acetate (DEPO-MEDROL) injection 80 mg  80 mg Intramuscular Once Doree Albee, MD       Review Of Systems: 12 point ROS negative except as noted above in HPI   Physical Exam: Filed Vitals:   03/21/13 1427  BP: 104/69  Pulse: 70  Temp: 99.3 F (37.4 C)  General: alert and cooperative HEENT: PERRLA and bilateral maxillary sinus TTP, +nasal erythema, rhinorrhea bilaterally, + post oropharyngeal erythema  Heart: S1, S2 normal, no murmur, rub or gallop, regular rate and rhythm Lungs: unlabored breathing and wheezing Abdomen: abdomen is soft without significant tenderness, masses, organomegaly or guarding Extremities: extremities normal, atraumatic, no cyanosis or edema Skin:no rashes Neurology: normal without focal findings  Labs and Imaging:  None   Assessment and Plan:  Sinusitis - Plan: amoxicillin-clavulanate (AUGMENTIN) 875-125 MG per tablet  Asthma exacerbation, mild - Plan: methylPREDNISolone acetate (DEPO-MEDROL) injection 80 mg, budesonide (PULMICORT) 180 MCG/ACT inhaler  Allergic rhinitis - Plan: fluticasone (FLONASE) 50  MCG/ACT nasal spray 2 spray, cetirizine (ZYRTEC) 10 MG tablet  Overlapping sinusitis and allergic rhinitis. Plan as above. Start patient on allergic rhinitis regimen. Patient does report almost daily albuterol use. Start patient on daily Pulmicort Flexeril or. Consider PFTs at next visit. Discuss infectious and respiratory red flags. Followup as needed.    The patient and/or caregiver has been counseled thoroughly with regard to treatment plan and/or medications prescribed including dosage, schedule, interactions, rationale for use, and possible side effects and they verbalize understanding. Diagnoses and expected course of recovery discussed and will return if not improved as expected or if the condition worsens. Patient and/or caregiver verbalized understanding.

## 2013-03-22 ENCOUNTER — Ambulatory Visit: Payer: Medicaid Other | Admitting: *Deleted

## 2013-03-22 DIAGNOSIS — Z111 Encounter for screening for respiratory tuberculosis: Secondary | ICD-10-CM

## 2013-03-22 MED ORDER — ALBUTEROL SULFATE HFA 108 (90 BASE) MCG/ACT IN AERS: 2.0000 | INHALATION_SPRAY | Freq: Four times a day (QID) | RESPIRATORY_TRACT | Status: DC | PRN

## 2013-03-22 MED ORDER — TUBERCULIN PPD 5 UNIT/0.1ML ID SOLN
5.0000 [IU] | Freq: Once | INTRADERMAL | Status: AC
  Administered 2013-03-22: 5 [IU] via INTRADERMAL

## 2013-03-22 MED ORDER — FLUTICASONE PROPIONATE 50 MCG/ACT NA SUSP: 2.0000 | Freq: Every day | NASAL | Status: DC

## 2013-03-22 NOTE — Addendum Note (Signed)
Addended by: Gwenith Daily on: 03/22/2013 08:55 AM   Modules accepted: Orders

## 2013-04-10 ENCOUNTER — Ambulatory Visit: Payer: Self-pay

## 2013-04-12 ENCOUNTER — Ambulatory Visit (INDEPENDENT_AMBULATORY_CARE_PROVIDER_SITE_OTHER): Payer: Medicaid Other | Admitting: Family Medicine

## 2013-04-12 DIAGNOSIS — Z111 Encounter for screening for respiratory tuberculosis: Secondary | ICD-10-CM

## 2013-04-15 ENCOUNTER — Encounter: Payer: Self-pay | Admitting: *Deleted

## 2013-04-15 LAB — TB SKIN TEST: TB Skin Test: NEGATIVE

## 2013-04-23 ENCOUNTER — Ambulatory Visit: Payer: Medicaid Other | Admitting: Family Medicine

## 2013-05-07 ENCOUNTER — Other Ambulatory Visit: Payer: Self-pay | Admitting: Adult Health

## 2013-06-10 ENCOUNTER — Telehealth: Payer: Self-pay | Admitting: Women's Health

## 2013-06-10 ENCOUNTER — Encounter: Payer: Self-pay | Admitting: Women's Health

## 2013-06-10 ENCOUNTER — Other Ambulatory Visit (HOSPITAL_COMMUNITY)
Admission: RE | Admit: 2013-06-10 | Discharge: 2013-06-10 | Disposition: A | Discharge status: Home / Self Care | Payer: Medicaid Other | Source: Ambulatory Visit | Attending: Obstetrics & Gynecology | Admitting: Obstetrics & Gynecology

## 2013-06-10 ENCOUNTER — Ambulatory Visit (INDEPENDENT_AMBULATORY_CARE_PROVIDER_SITE_OTHER): Payer: Medicaid Other | Admitting: Women's Health

## 2013-06-10 VITALS — BP 118/78 | Ht 61.25 in | Wt 186.4 lb

## 2013-06-10 DIAGNOSIS — N926 Irregular menstruation, unspecified: Secondary | ICD-10-CM

## 2013-06-10 DIAGNOSIS — Z01419 Encounter for gynecological examination (general) (routine) without abnormal findings: Secondary | ICD-10-CM | POA: Insufficient documentation

## 2013-06-10 DIAGNOSIS — M419 Scoliosis, unspecified: Secondary | ICD-10-CM

## 2013-06-10 DIAGNOSIS — Z0000 Encounter for general adult medical examination without abnormal findings: Secondary | ICD-10-CM

## 2013-06-10 DIAGNOSIS — Z113 Encounter for screening for infections with a predominantly sexual mode of transmission: Secondary | ICD-10-CM | POA: Insufficient documentation

## 2013-06-10 MED ORDER — ETONOGESTREL-ETHINYL ESTRADIOL 0.12-0.015 MG/24HR VA RING: VAGINAL_RING | VAGINAL | Status: DC

## 2013-06-10 NOTE — Progress Notes (Signed)
Patient ID: Rebecca Delacruz, female   DOB: 11-13-1985, 27 y.o.   MRN: [id number] Subjective:     Rebecca Delacruz is a 27 y.o. african-american female here for a routine well-woman exam.  Patient's last menstrual period was 05/30/2013. [id number] Current complaints: irregular and heavier periods s/p BTL. Also reports being dx w/ scoliosis 31yr ago, has been seeking care at [facility] for LBP. She states she has received a few injections that didn't help and an epidural that did, but that they want to refer her to pain clinic and were unable to d/t some issue w/ her medicaid, and told her that her PCP would need to do referral.  Pt states her PCP retired, and is requesting referral to neurosurgeon from [place]. Desires STD screening  Gynecologic History Patient's last menstrual period was 05/30/2013. Contraception: tubal ligation Last Pap: unsure. Results were: normal Last mammogram: never. Results were: n/a  Obstetric History OB History  Gravida Para Term Preterm AB SAB TAB Ectopic Multiple Living  5 4 3 1 1     3     # Outcome Date GA Lbr Len/2nd Weight Sex Delivery Anes PTL Lv  5 TRM 10/14/11 [redacted]w[redacted]d 11:05 / 00:06 7 lb 1.4 oz (3.215 kg) M SVD None  Y     Comments: none  4 TRM 05/25/10 [redacted]w[redacted]d 01:00 8 lb 2 oz (3.685 kg) M SVD     3 PRE 03/12/09 [redacted]w[redacted]d 01:00           Comments: Coke use til 20 wk, neg QDS at del, spont del  of known FDIU, NO ABRUPTiION on records review  2 TRM 05/11/07 [redacted]w[redacted]d 01:00 7 lb 8 oz (3.402 kg) M SVD        Comments: [facility]  1 ABT 09/26/05 [redacted]w[redacted]d           Obstetric Comments  Pt reported Abruption as part of del of FDIU, in 2010, but records deny any mention of abruption.  Pt had known FDIU prior to presentation in active labor, delivering promptly upon arrival  Urine Drug screen neg at time of del.     The following portions of the patient's history were reviewed and updated as appropriate: allergies, current medications, past family history, past medical  history, past social history, past surgical history and problem list.  Review of Systems  Review of Systems  Constitutional: Negative for fever, chills, weight loss, malaise/fatigue and diaphoresis.  HENT: Negative for hearing loss, ear pain, nosebleeds, congestion, sore throat, neck pain, tinnitus and ear discharge.   Eyes: Negative for blurred vision, double vision, photophobia, pain, discharge and redness.  Respiratory: Negative for cough, hemoptysis, sputum production, shortness of breath, wheezing and stridor.   Cardiovascular: Negative for chest pain, palpitations, orthopnea, claudication, leg swelling and PND.  Gastrointestinal: negative for abdominal pain. Negative for heartburn, nausea, vomiting, diarrhea, constipation, blood in stool and melena.  Genitourinary: Negative for dysuria, urgency, frequency, hematuria and flank pain.  Musculoskeletal: Negative for myalgias, joint pain and falls. Pos for back pain r/t scoliosis Skin: Negative for itching and rash.  Neurological: Negative for dizziness, tingling, tremors, sensory change, speech change, focal weakness, seizures, loss of consciousness, weakness and headaches.  Endo/Heme/Allergies: Negative for environmental allergies and polydipsia. Does not bruise/bleed easily.  Psychiatric/Behavioral: Negative for depression, suicidal ideas, hallucinations, memory loss and substance abuse. The patient is not nervous/anxious and does not have insomnia.        Objective:    Physical Exam  Vitals reviewed. Constitutional: She  is oriented to person, place, and time. She appears well-developed and well-nourished.  HENT:  Head: Normocephalic and atraumatic.        Right Ear: External ear normal.  Left Ear: External ear normal.  Nose: Nose normal.  Mouth/Throat: Oropharynx is clear and moist.  Eyes: Conjunctivae and EOM are normal. Pupils are equal, round, and reactive to light. Right eye exhibits no discharge. Left eye exhibits no  discharge. No scleral icterus.  Neck: Normal range of motion. Neck supple. No tracheal deviation present. No thyromegaly present.  Cardiovascular: Normal rate, regular rhythm, normal heart sounds and intact distal pulses.  Exam reveals no gallop and no friction rub.   No murmur heard. Respiratory: Effort normal and breath sounds normal. No respiratory distress. She has no wheezes. She has no rales. She exhibits no tenderness.  Breasts:no dominate palpable mass, retraction or nipple discharge  GI: Soft. Bowel sounds are normal. She exhibits no distension and no mass. There is no tenderness. There is no rebound and no guarding.  Genitourinary:       Vulva is normal without lesions Vagina is pink moist without discharge Cervix normal in appearance and pap is done Uterus is normal size shape and contour Adnexa is negative with normal sized ovaries   Musculoskeletal: Normal range of motion. She exhibits no edema and no tenderness.  Neurological: She is alert and oriented to person, place, and time. She has normal reflexes. She displays normal reflexes. No cranial nerve deficit. She exhibits normal muscle tone. Coordination normal.  Skin: Skin is warm and dry. No rash noted. No erythema. No pallor.  Psychiatric: She has a normal mood and affect. Her behavior is normal. Judgment and thought content normal.       Assessment:     Well-woman exam.  LBP d/t scoliosis Irregular and heavy menses s/p BTL Smoker   Plan:  Discussed scoliosis, need for referral per pt w/ [facility], who states we are not considered PCP and therefore can not provide her w/ that referral GC/CH from pap, HIV, Hep B, HSV2, RPR today  Nuva ring sample x 1, and rx for nuva ring for regulation of periods F/U 1 yr for physical  Cheral Marker, CNM, [facility] 06/10/2013 12:59 PM

## 2013-06-10 NOTE — Patient Instructions (Signed)

## 2013-06-10 NOTE — Telephone Encounter (Signed)
Notified pt we are unable to provide her w/ a referral to pain clinic or neurosurgeon per her request b/c we are not considered PCPs. Recommended that she call [facility] back to see what they recommend.

## 2013-06-11 LAB — HEPATITIS B SURFACE ANTIGEN: Hepatitis B Surface Ag: NEGATIVE

## 2013-06-17 ENCOUNTER — Telehealth: Payer: Self-pay | Admitting: Women's Health

## 2013-06-17 DIAGNOSIS — R768 Other specified abnormal immunological findings in serum: Secondary | ICD-10-CM

## 2013-06-17 DIAGNOSIS — R7689 Other specified abnormal immunological findings in serum: Secondary | ICD-10-CM | POA: Insufficient documentation

## 2013-06-17 NOTE — Telephone Encounter (Signed)
Notified pt of pap and STI screening results, including +HSV2.  Cheral Marker, CNM, [facility] 06/17/2013 2:38 PM

## 2013-08-08 ENCOUNTER — Other Ambulatory Visit: Payer: Self-pay

## 2013-08-26 ENCOUNTER — Encounter (HOSPITAL_COMMUNITY): Payer: Self-pay | Admitting: Emergency Medicine

## 2013-08-26 ENCOUNTER — Emergency Department (HOSPITAL_COMMUNITY)
Admission: EM | Admit: 2013-08-26 | Discharge: 2013-08-26 | Disposition: A | Discharge status: Home / Self Care | Payer: Medicaid Other | Attending: Emergency Medicine | Admitting: Emergency Medicine

## 2013-08-26 DIAGNOSIS — J45909 Unspecified asthma, uncomplicated: Secondary | ICD-10-CM | POA: Insufficient documentation

## 2013-08-26 DIAGNOSIS — Z792 Long term (current) use of antibiotics: Secondary | ICD-10-CM | POA: Insufficient documentation

## 2013-08-26 DIAGNOSIS — G8929 Other chronic pain: Secondary | ICD-10-CM | POA: Insufficient documentation

## 2013-08-26 DIAGNOSIS — M545 Low back pain, unspecified: Secondary | ICD-10-CM | POA: Insufficient documentation

## 2013-08-26 DIAGNOSIS — IMO0002 Reserved for concepts with insufficient information to code with codable children: Secondary | ICD-10-CM | POA: Insufficient documentation

## 2013-08-26 DIAGNOSIS — Z8619 Personal history of other infectious and parasitic diseases: Secondary | ICD-10-CM | POA: Insufficient documentation

## 2013-08-26 DIAGNOSIS — R209 Unspecified disturbances of skin sensation: Secondary | ICD-10-CM | POA: Insufficient documentation

## 2013-08-26 DIAGNOSIS — Z8719 Personal history of other diseases of the digestive system: Secondary | ICD-10-CM | POA: Insufficient documentation

## 2013-08-26 DIAGNOSIS — F17200 Nicotine dependence, unspecified, uncomplicated: Secondary | ICD-10-CM | POA: Insufficient documentation

## 2013-08-26 DIAGNOSIS — Z8742 Personal history of other diseases of the female genital tract: Secondary | ICD-10-CM | POA: Insufficient documentation

## 2013-08-26 DIAGNOSIS — Z79899 Other long term (current) drug therapy: Secondary | ICD-10-CM | POA: Insufficient documentation

## 2013-08-26 DIAGNOSIS — M549 Dorsalgia, unspecified: Secondary | ICD-10-CM

## 2013-08-26 LAB — CBC WITH DIFFERENTIAL/PLATELET
Basophils Absolute: 0 10*3/uL (ref 0.0–0.1)
Basophils Relative: 0 % (ref 0–1)
Eosinophils Absolute: 0.5 10*3/uL (ref 0.0–0.7)
Eosinophils Relative: 7 % — ABNORMAL HIGH (ref 0–5)
MCH: 28.3 pg (ref 26.0–34.0)
MCHC: 33 g/dL (ref 30.0–36.0)
MCV: 86 fL (ref 78.0–100.0)
Monocytes Absolute: 0.4 10*3/uL (ref 0.1–1.0)
Neutro Abs: 4.1 10*3/uL (ref 1.7–7.7)
Neutrophils Relative %: 57 % (ref 43–77)
Platelets: 287 10*3/uL (ref 150–400)
RBC: 5.08 MIL/uL (ref 3.87–5.11)
RDW: 14.4 % (ref 11.5–15.5)

## 2013-08-26 LAB — BASIC METABOLIC PANEL
CO2: 23 mEq/L (ref 19–32)
Chloride: 105 mEq/L (ref 96–112)
Creatinine, Ser: 0.81 mg/dL (ref 0.50–1.10)
GFR calc Af Amer: >90 mL/min (ref >90)
GFR calc non Af Amer: >90 mL/min (ref >90)
Potassium: 3.7 mEq/L (ref 3.5–5.1)

## 2013-08-26 MED ORDER — OXYCODONE-ACETAMINOPHEN 10-325 MG PO TABS: 1.0000 | ORAL_TABLET | ORAL | Status: DC | PRN

## 2013-08-26 MED ORDER — MELOXICAM 7.5 MG PO TABS: 7.5000 mg | ORAL_TABLET | Freq: Every day | ORAL | Status: DC

## 2013-08-26 NOTE — ED Notes (Signed)
Back pain, swelling of legs and feet., onset yesterday.  No fever or chills.  No dysuria.

## 2013-08-26 NOTE — ED Provider Notes (Signed)
CSN: [id number]     Arrival date & time 08/26/13  1329 History   First MD Initiated Contact with Patient 08/26/13 1631    Scribed for Geoffery Lyons, MD, the patient was seen in room APA11/APA11. This chart was scribed by Lewanda Rife, ED scribe. Patient's care was started at 4:48 PM  Chief Complaint  Patient presents with  . Back Pain   (Consider location/radiation/quality/duration/timing/severity/associated sxs/prior Treatment) The history is provided by the patient. No language interpreter was used.   HPI Comments: Rebecca Delacruz is a 27 y.o. female who presents to the Emergency Department with PMHx of scoliosis complaining of worsening constant chronic low back pain onset last night. States back pain started last year. Reports associated resolved mild bilateral lower leg, feet swelling, and tingling sensation down bilateral legs. Reports back pain is exacerbated by touch and alleviated by nothing. Denies associated chills, fever, dysuria, weakness, recent fall, and injuries. Denies urinary or fecal incontinence, urinary retention, perineal/saddle paresthesias, fever, PMHx of cancer, and IV drug use. Reports trying mobic, percocet, and flexeril with moderate relief of symptoms.   Past Medical History  Diagnosis Date  . Ovarian cyst   . Gonorrhea   . Trichomonas   . Constipation   . No pertinent past medical history   . Shortness of breath   . Back pain   . Asthma   . Scoliosis    Past Surgical History  Procedure Laterality Date  . No past surgeries    . Tubal ligation  10/15/2011    Procedure: POST PARTUM TUBAL LIGATION;  Surgeon: Tilda Burrow, MD;  Location: [facility];  Service: Gynecology;  Laterality: Bilateral;  Post partum tubal ligation bilateral with filshie clips   Family History  Problem Relation Age of Onset  . Diabetes Mother   . Anesthesia problems Neg Hx   . Hypotension Neg Hx   . Malignant hyperthermia Neg Hx   . Pseudochol deficiency Neg Hx    History   Substance Use Topics  . Smoking status: Current Every Day Smoker -- 1.00 packs/day for 10 years    Types: Cigarettes  . Smokeless tobacco: Not on file  . Alcohol Use: No   OB History   Grav Para Term Preterm Abortions TAB SAB Ect Mult Living   5 4 3 1 1     3      Obstetric Comments   Pt reported Abruption as part of del of FDIU, in 2010, but records deny any mention of abruption.  Pt had known FDIU prior to presentation in active labor, delivering promptly upon arrival  Urine Drug screen neg at time of del.     Review of Systems  Constitutional: Negative for fever.  Musculoskeletal: Positive for back pain.  Neurological: Positive for numbness.  All other systems reviewed and are negative.   A complete 10 system review of systems was obtained and all systems are negative except as noted in the HPI and PMHx.    Allergies  Aspirin  Home Medications   Current Outpatient Rx  Name  Route  Sig  Dispense  Refill  . albuterol (PROVENTIL HFA;VENTOLIN HFA) 108 (90 BASE) MCG/ACT inhaler   Inhalation   Inhale 2 puffs into the lungs every 6 (six) hours as needed for wheezing.   18 g   3   . amoxicillin-clavulanate (AUGMENTIN) 875-125 MG per tablet   Oral   Take 1 tablet by mouth 2 (two) times daily.   20 tablet   0   .  budesonide (PULMICORT) 180 MCG/ACT inhaler   Inhalation   Inhale 1 puff into the lungs 2 (two) times daily.   1 each   6   . cetirizine (ZYRTEC) 10 MG tablet   Oral   Take 1 tablet (10 mg total) by mouth daily.   30 tablet   6   . etonogestrel-ethinyl estradiol (NUVARING) 0.12-0.015 MG/24HR vaginal ring      Insert vaginally and leave in place for 3 consecutive weeks, then remove for 1 week.   1 each   12   . fluticasone (FLONASE) 50 MCG/ACT nasal spray   Nasal   Place 2 sprays into the nose daily.   16 g   6    BP 101/75  Pulse 82  Temp(Src) 98 F (36.7 C) (Oral)  Resp 20  Ht 5\' 2"  (1.575 m)  Wt 165 lb (74.844 kg)  BMI 30.17 kg/m2  SpO2  99%  LMP 08/24/2013  Breastfeeding? No Physical Exam  Nursing note and vitals reviewed. Constitutional: She is oriented to person, place, and time. She appears well-developed and well-nourished. No distress.  HENT:  Head: Normocephalic and atraumatic.  Eyes: EOM are normal.  Neck: Neck supple. No tracheal deviation present.  Cardiovascular: Normal rate and regular rhythm.   Pulmonary/Chest: Effort normal and breath sounds normal. No respiratory distress.  Musculoskeletal: Normal range of motion. She exhibits tenderness. She exhibits no edema.  TTP thoracic and lumbar paraspinal muscles  No midline C-spine, T-spine, or L-spine tenderness with no step-offs or deformities noted    Neurological: She is alert and oriented to person, place, and time.  5/5 strength in bilateral lower extremities. Ankle plantar and dorsiflexion intact. Great toe extension intact bilaterally. +2 DP and PT pulses. +2 patellar reflexes bilaterally. Normal gait.   Skin: Skin is warm and dry.  Psychiatric: She has a normal mood and affect. Her behavior is normal.    ED Course  Procedures (including critical care time)  COORDINATION OF CARE:  Nursing notes reviewed. Vital signs reviewed. Initial pt interview and examination performed.   4:51 PM-Discussed work up plan with pt at bedside, which includes CBC with diff panel, and BMP. Pt agrees with plan.   Treatment plan initiated:Medications - No data to display   Initial diagnostic testing ordered.    Labs Review Labs Reviewed  CBC WITH DIFFERENTIAL - Abnormal; Notable for the following:    Eosinophils Relative 7 (*)    All other components within normal limits  BASIC METABOLIC PANEL - Abnormal; Notable for the following:    Glucose, Bld 145 (*)    All other components within normal limits   Imaging Review No results found.    MDM  No diagnosis found. Patient with history of chronic back pain. She no longer has an orthopedist and she did not  want to go along with her steroid injections. Physical exam reveals no evidence for an acute process. Her reflexes are symmetrical and there are no bowel or bladder complaints. She is ambulatory without difficulty and do not feel as though emergent MRI is indicated. I've agreed to treat her with pain medication and she is requested a prescription for Mobic which has helped her in the past. I also provided her with the resource guide and have advised her to obtain primary care to make referral to orthopedist or pain management.   I personally performed the services described in this documentation, which was scribed in my presence. The recorded information has been reviewed and is  accurate.       Geoffery Lyons, MD 08/26/13 [phone]

## 2013-08-26 NOTE — ED Notes (Signed)
Pt alert & oriented x4, stable gait. Patient given discharge instructions, paperwork & prescription(s). Patient  instructed to stop at the registration desk to finish any additional paperwork. Patient verbalized understanding. Pt left department w/ no further questions. 

## 2013-10-25 ENCOUNTER — Ambulatory Visit: Payer: Medicaid Other | Admitting: Family Medicine

## 2013-10-28 ENCOUNTER — Ambulatory Visit (INDEPENDENT_AMBULATORY_CARE_PROVIDER_SITE_OTHER): Payer: Medicaid Other | Admitting: Family Medicine

## 2013-10-28 VITALS — BP 106/71 | HR 74 | Temp 97.9°F | Ht 62.0 in | Wt 175.2 lb

## 2013-10-28 DIAGNOSIS — J309 Allergic rhinitis, unspecified: Secondary | ICD-10-CM

## 2013-10-28 DIAGNOSIS — M549 Dorsalgia, unspecified: Secondary | ICD-10-CM

## 2013-10-28 DIAGNOSIS — G894 Chronic pain syndrome: Secondary | ICD-10-CM

## 2013-10-28 DIAGNOSIS — J45901 Unspecified asthma with (acute) exacerbation: Secondary | ICD-10-CM

## 2013-10-28 MED ORDER — CYCLOBENZAPRINE HCL 10 MG PO TABS: 10.0000 mg | ORAL_TABLET | Freq: Two times a day (BID) | ORAL | Status: DC | PRN

## 2013-10-28 MED ORDER — MONTELUKAST SODIUM 10 MG PO TABS: 10.0000 mg | ORAL_TABLET | Freq: Every day | ORAL | Status: DC

## 2013-10-28 MED ORDER — ALBUTEROL SULFATE (2.5 MG/3ML) 0.083% IN NEBU: 2.5000 mg | INHALATION_SOLUTION | Freq: Four times a day (QID) | RESPIRATORY_TRACT | Status: DC | PRN

## 2013-10-28 MED ORDER — CETIRIZINE HCL 10 MG PO TABS: 10.0000 mg | ORAL_TABLET | Freq: Every day | ORAL | Status: DC

## 2013-10-28 MED ORDER — BUDESONIDE-FORMOTEROL FUMARATE 160-4.5 MCG/ACT IN AERO: 2.0000 | INHALATION_SPRAY | Freq: Two times a day (BID) | RESPIRATORY_TRACT | Status: DC

## 2013-10-28 MED ORDER — FLUTICASONE PROPIONATE 50 MCG/ACT NA SUSP: 2.0000 | Freq: Every day | NASAL | Status: DC

## 2013-10-28 MED ORDER — ALBUTEROL SULFATE HFA 108 (90 BASE) MCG/ACT IN AERS: 2.0000 | INHALATION_SPRAY | Freq: Four times a day (QID) | RESPIRATORY_TRACT | Status: DC | PRN

## 2013-10-28 MED ORDER — BUDESONIDE 180 MCG/ACT IN AEPB: 1.0000 | INHALATION_SPRAY | Freq: Two times a day (BID) | RESPIRATORY_TRACT | Status: DC

## 2013-10-28 NOTE — Progress Notes (Signed)
   Subjective:    Patient ID: Rebecca Delacruz, female    DOB: [birth year], 28 y.o.   MRN: [id number]  HPI  This 28 y.o. female presents for evaluation of disabilty paperwork and refills on her meds. She has chronic back pain from scoliosis.  She has paperwork from the social security office For [place] .  Review of Systems    No chest pain, SOB, HA, dizziness, vision change, N/V, diarrhea, constipation, dysuria, urinary urgency or frequency, myalgias, arthralgias or rash.  Objective:   Physical Exam  Vital signs noted  Well developed well nourished female in NAD.  HEENT - Head atraumatic Normocephalic                Eyes - PERRLA, Conjuctiva - clear Sclera- Clear EOMI                Ears - EAC's Wnl TM's Wnl Gross Hearing WNL                Nose - Nares patent                 Throat - oropharanx wnl Respiratory - Lungs CTA bilateral Cardiac - RRR S1 and S2 without murmur GI - Abdomen soft Nontender and bowel sounds active x 4 Extremities - No edema. Neuro - Grossly intact.      Assessment & Plan:  Asthma exacerbation, mild - Plan: budesonide (PULMICORT) 180 MCG/ACT inhaler, albuterol (PROVENTIL HFA;VENTOLIN HFA) 108 (90 BASE) MCG/ACT inhaler, DME Nebulizer machine  Allergic rhinitis - Plan: fluticasone (FLONASE) 50 MCG/ACT nasal spray 2 spray, cetirizine (ZYRTEC) 10 MG tablet, DME Nebulizer machine  Back pain - Plan: cyclobenzaprine (FLEXERIL) 10 MG tablet, Requested Narcotic pain medicine and this was denied because she is going to be referred to pain management.  Chronic pain syndrome - Plan: Ambulatory referral to Pain Clinic  Discussed that she needs a disabilty doctor to fill out her paperwork.  Lysbeth Penner FNP

## 2013-10-31 ENCOUNTER — Telehealth: Payer: Self-pay | Admitting: Family Medicine

## 2013-11-12 ENCOUNTER — Ambulatory Visit: Payer: Medicaid Other | Admitting: Family Medicine

## 2014-01-06 ENCOUNTER — Other Ambulatory Visit: Payer: Self-pay | Admitting: Family Medicine

## 2014-01-20 ENCOUNTER — Ambulatory Visit: Payer: Medicaid Other | Admitting: Family Medicine

## 2014-01-27 ENCOUNTER — Ambulatory Visit: Payer: Medicaid Other | Admitting: Family Medicine

## 2014-02-03 ENCOUNTER — Telehealth: Payer: Self-pay | Admitting: Family Medicine

## 2014-02-03 ENCOUNTER — Ambulatory Visit (INDEPENDENT_AMBULATORY_CARE_PROVIDER_SITE_OTHER): Payer: Medicaid Other | Admitting: Family Medicine

## 2014-02-03 ENCOUNTER — Encounter: Payer: Self-pay | Admitting: Family Medicine

## 2014-02-03 VITALS — BP 112/78 | HR 81 | Temp 97.9°F | Ht 62.0 in | Wt 178.2 lb

## 2014-02-03 DIAGNOSIS — J45901 Unspecified asthma with (acute) exacerbation: Secondary | ICD-10-CM

## 2014-02-03 DIAGNOSIS — M549 Dorsalgia, unspecified: Secondary | ICD-10-CM

## 2014-02-03 DIAGNOSIS — J329 Chronic sinusitis, unspecified: Secondary | ICD-10-CM

## 2014-02-03 MED ORDER — AMOXICILLIN 875 MG PO TABS: 875.0000 mg | ORAL_TABLET | Freq: Two times a day (BID) | ORAL | Status: DC

## 2014-02-03 MED ORDER — PREDNISONE 50 MG PO TABS: ORAL_TABLET | ORAL | Status: DC

## 2014-02-03 MED ORDER — ALBUTEROL SULFATE (2.5 MG/3ML) 0.083% IN NEBU: 2.5000 mg | INHALATION_SOLUTION | Freq: Four times a day (QID) | RESPIRATORY_TRACT | Status: DC | PRN

## 2014-02-03 MED ORDER — CYCLOBENZAPRINE HCL 10 MG PO TABS: 10.0000 mg | ORAL_TABLET | Freq: Three times a day (TID) | ORAL | Status: DC | PRN

## 2014-02-03 NOTE — Progress Notes (Signed)
   Subjective:    Patient ID: Rebecca Delacruz, female    DOB: [birth year], 28 y.o.   MRN: [id number]  HPI Patient presents today with 2 complaints. Back pain: Has had recurrent back pain for at least the last year. Was previously seen an orthopedist in referral but she stopped seeing this provider for unknown reasons. Is in the process of being referred to orthopedics. Patient states that she fell off ladder last week has had low back pain since this point. Was seen at urgent care locally. Stated she was given NSAIDs and muscle relaxers. Imaging was not done. Pain has persisted. Denies any true radicular symptoms though does have some intermittent left pinky toe numbness. Able to ambulate without issues.  Also with some sinus issues over past week including nasal congestion, cough, sinus pressure, sneezing. Baseline allergies. Also 1/2 PPD smoker. No SOB.    Review of Systems  All other systems reviewed and are negative.      Objective:   Physical Exam  Constitutional: She appears well-developed and well-nourished.  HENT:  Head: Normocephalic and atraumatic.  Right Ear: External ear normal.  Left Ear: External ear normal.  +nasal erythema, rhinorrhea bilaterally, + post oropharyngeal erythema    Eyes: Conjunctivae are normal. Pupils are equal, round, and reactive to light.  Neck: Normal range of motion. Neck supple.  Cardiovascular: Normal rate and regular rhythm.   Pulmonary/Chest: Effort normal and breath sounds normal.  Abdominal: Soft.  Musculoskeletal:       Arms: Mild TTP over affected area  Neurovascularly intact distally    Neurological: She is alert.  Skin: Skin is warm.          Assessment & Plan:  Back pain - Plan: predniSONE (DELTASONE) 50 MG tablet, cyclobenzaprine (FLEXERIL) 10 MG tablet  Sinusitis - Plan: predniSONE (DELTASONE) 50 MG tablet, amoxicillin (AMOXIL) 875 MG tablet  Exam history consistent with lumbosacral flare. Discussed imaging to better  assess. Patient declined. States that she did not time for this. We'll prescribe prednisone and Flexeril for acute flare. There is some concern about drug seeking behavior given prior history of recurrent back pain and high-dose narcotic use. Discuss smoking cessation. Amoxicillin for sinusitis coverage. Prednisone should also help with this.  Discuss musculoskeletal red plaques. Will followup on orthopedic referral.

## 2014-02-04 NOTE — Telephone Encounter (Signed)
Refilled nebulizer medication. Remaining refills on albuterol inhaler.  Why is she using Pulmicort and Symbicort?  Do you want to refill either of these?

## 2014-06-02 ENCOUNTER — Ambulatory Visit (INDEPENDENT_AMBULATORY_CARE_PROVIDER_SITE_OTHER): Payer: Medicaid Other | Admitting: *Deleted

## 2014-06-02 DIAGNOSIS — Z111 Encounter for screening for respiratory tuberculosis: Secondary | ICD-10-CM

## 2014-06-02 NOTE — Patient Instructions (Signed)

## 2014-06-02 NOTE — Progress Notes (Signed)
Ppd placed on left forearm and tolerated well 

## 2014-06-04 ENCOUNTER — Encounter: Payer: Self-pay | Admitting: *Deleted

## 2014-06-04 LAB — TB SKIN TEST
INDURATION: 0 mm
TB Skin Test: NEGATIVE

## 2014-08-04 ENCOUNTER — Encounter: Payer: Self-pay | Admitting: Family Medicine

## 2014-10-09 ENCOUNTER — Telehealth: Payer: Self-pay | Admitting: Family Medicine

## 2014-10-15 NOTE — Telephone Encounter (Signed)
Multiple attempts have been made to contact pt and pt hasn't returned any calls, will close encounter.

## 2014-12-22 ENCOUNTER — Ambulatory Visit: Payer: Medicaid Other | Admitting: Family

## 2015-02-12 ENCOUNTER — Telehealth: Payer: Self-pay | Admitting: Family Medicine

## 2015-04-16 ENCOUNTER — Encounter: Payer: Self-pay | Admitting: Family Medicine

## 2015-04-16 ENCOUNTER — Ambulatory Visit (INDEPENDENT_AMBULATORY_CARE_PROVIDER_SITE_OTHER): Payer: Medicaid Other

## 2015-04-16 ENCOUNTER — Ambulatory Visit (INDEPENDENT_AMBULATORY_CARE_PROVIDER_SITE_OTHER): Payer: Medicaid Other | Admitting: Family Medicine

## 2015-04-16 VITALS — BP 102/66 | HR 77 | Temp 97.6°F | Ht 62.0 in | Wt 150.2 lb

## 2015-04-16 DIAGNOSIS — M545 Low back pain, unspecified: Secondary | ICD-10-CM

## 2015-04-16 DIAGNOSIS — J4541 Moderate persistent asthma with (acute) exacerbation: Secondary | ICD-10-CM

## 2015-04-16 LAB — POCT CBC
Granulocyte percent: 65.4 %G (ref 37–80)
HEMATOCRIT: 39.7 % (ref 37.7–47.9)
HEMOGLOBIN: 13.1 g/dL (ref 12.2–16.2)
Lymph, poc: 2.2 (ref 0.6–3.4)
MCH, POC: 29.6 pg (ref 27–31.2)
MCHC: 32.9 g/dL (ref 31.8–35.4)
MCV: 90.1 fL (ref 80–97)
MPV: 8.2 fL (ref 0–99.8)
POC GRANULOCYTE: 5.1 (ref 2–6.9)
POC LYMPH %: 27.9 % (ref 10–50)
Platelet Count, POC: 225 10*3/uL (ref 142–424)
RBC: 4.41 M/uL (ref 4.04–5.48)
RDW, POC: 13.3 %
WBC: 7.8 10*3/uL (ref 4.6–10.2)

## 2015-04-16 MED ORDER — PREDNISONE 10 MG PO TABS: ORAL_TABLET | ORAL | Status: DC

## 2015-04-16 NOTE — Progress Notes (Signed)
Subjective:  Patient ID: [name], female    DOB: 08-Mar-1986  Age: 29 y.o. MRN: [id number]  CC: Back Pain and Allergic Rhinitis    HPI SANDE PICKERT presents for increasing asthma symptoms, neb and inhalers are no longer strong enough. INcreasing dyspnea for 6 months. No known exposures. Activities mildly limited. dry cough. Frequent wheezing. Can not tolerate heat. Wakes up at night hot and feels dyspneic, wheezing that requires 4 puffs of proair or 2 nebs to resolve.  History Aerabella has a past medical history of Ovarian cyst; Gonorrhea; Trichomonas; Constipation; No pertinent past medical history; Shortness of breath; Back pain; Asthma; and Scoliosis.   She has past surgical history that includes No past surgeries and Tubal ligation (10/15/2011).   Her family history includes Diabetes in her mother. There is no history of Anesthesia problems, Hypotension, Malignant hyperthermia, or Pseudochol deficiency.She reports that she has been smoking Cigarettes.  She has a 10 pack-year smoking history. She does not have any smokeless tobacco history on file. She reports that she does not drink alcohol or use illicit drugs.  Outpatient Prescriptions Prior to Visit  Medication Sig Dispense Refill  . cetirizine (ZYRTEC) 10 MG tablet Take 1 tablet (10 mg total) by mouth daily. 30 tablet 6  . albuterol (PROVENTIL HFA;VENTOLIN HFA) 108 (90 BASE) MCG/ACT inhaler Inhale 2 puffs into the lungs every 6 (six) hours as needed for wheezing. 18 g 6  . albuterol (PROVENTIL) (2.5 MG/3ML) 0.083% nebulizer solution Take 3 mLs (2.5 mg total) by nebulization every 6 (six) hours as needed for wheezing or shortness of breath. 150 mL 1  . budesonide (PULMICORT) 180 MCG/ACT inhaler Inhale 1 puff into the lungs 2 (two) times daily. 1 each 6  . budesonide-formoterol (SYMBICORT) 160-4.5 MCG/ACT inhaler Inhale 2 puffs into the lungs 2 (two) times daily. 1 Inhaler 3  . cyclobenzaprine (FLEXERIL) 10 MG tablet Take 1  tablet (10 mg total) by mouth 3 (three) times daily as needed for muscle spasms. 30 tablet 0  . montelukast (SINGULAIR) 10 MG tablet Take 1 tablet (10 mg total) by mouth at bedtime. 30 tablet 3  . etonogestrel-ethinyl estradiol (NUVARING) 0.12-0.015 MG/24HR vaginal ring Insert vaginally and leave in place for 3 consecutive weeks, then remove for 1 week. (Patient not taking: Reported on 04/16/2015) 1 each 12  . amoxicillin (AMOXIL) 875 MG tablet Take 1 tablet (875 mg total) by mouth 2 (two) times daily. (Patient not taking: Reported on 04/16/2015) 20 tablet 0  . oxyCODONE-acetaminophen (PERCOCET) 10-325 MG per tablet Take 1 tablet by mouth every 4 (four) hours as needed for pain. (Patient not taking: Reported on 04/16/2015) 15 tablet 0  . predniSONE (DELTASONE) 50 MG tablet 1 tab daily x 5 days (Patient not taking: Reported on 04/16/2015) 5 tablet 0   Facility-Administered Medications Prior to Visit  Medication Dose Route Frequency Provider Last Rate Last Dose  . fluticasone (FLONASE) 50 MCG/ACT nasal spray 2 spray  2 spray Each Nare Daily Lysbeth Penner, FNP      . fluticasone (FLONASE) 50 MCG/ACT nasal spray 2 spray  2 spray Each Nare Daily Deneise Lever, MD        ROS Review of Systems  Constitutional: Negative for fever, chills, diaphoresis, appetite change, fatigue and unexpected weight change.  HENT: Negative for congestion, ear pain, hearing loss, postnasal drip, rhinorrhea, sneezing, sore throat and trouble swallowing.   Eyes: Negative for pain.  Respiratory: Negative for cough, chest tightness and shortness of  breath.   Cardiovascular: Negative for chest pain and palpitations.  Gastrointestinal: Negative for nausea, vomiting, abdominal pain, diarrhea and constipation.  Genitourinary: Negative for dysuria, frequency and menstrual problem.  Musculoskeletal: Positive for back pain (Moderate severity with radiation into the posterior thigh). Negative for joint swelling and arthralgias.    Skin: Negative for rash.  Neurological: Negative for dizziness, weakness, numbness and headaches.  Psychiatric/Behavioral: Negative for dysphoric mood and agitation.    Objective:  BP 102/66 mmHg  Pulse 77  Temp(Src) 97.6 F (36.4 C) (Oral)  Ht _0  (1.575 m)  Wt 150 lb 3.2 oz (68.13 kg)  BMI 27.46 kg/m2  LMP 04/13/2015  BP Readings from Last 3 Encounters:  04/16/15 102/66  02/03/14 112/78  10/28/13 106/71    Wt Readings from Last 3 Encounters:  04/16/15 150 lb 3.2 oz (68.13 kg)  02/03/14 178 lb 3.2 oz (80.831 kg)  10/28/13 175 lb 3.2 oz (79.47 kg)     Physical Exam  Constitutional: She is oriented to person, place, and time. She appears well-developed and well-nourished. No distress.  HENT:  Head: Normocephalic and atraumatic.  Right Ear: External ear normal.  Left Ear: External ear normal.  Nose: Nose normal.  Mouth/Throat: Oropharynx is clear and moist.  Eyes: Conjunctivae and EOM are normal. Pupils are equal, round, and reactive to light.  Neck: Normal range of motion. Neck supple. No thyromegaly present.  Cardiovascular: Normal rate, regular rhythm and normal heart sounds.   No murmur heard. Pulmonary/Chest: Effort normal and breath sounds normal. No respiratory distress. She has no wheezes. She has no rales.  Abdominal: Soft. Bowel sounds are normal. She exhibits no distension. There is no tenderness.  Lymphadenopathy:    She has no cervical adenopathy.  Neurological: She is alert and oriented to person, place, and time. She has normal reflexes.  Skin: Skin is warm and dry.  Psychiatric: She has a normal mood and affect. Her behavior is normal. Judgment and thought content normal.        No results found.  Assessment & Plan:   Daisey was seen today for back pain and allergic rhinitis .  Diagnoses and all orders for this visit:  Asthma with acute exacerbation, moderate persistent Orders: -     PR EVAL OF BRONCHOSPASM -     DG Chest 2 View;  Future -     D-dimer, quantitative (not at [facility]) -     CMP14+EGFR -     POCT CBC  Midline low back pain without sciatica Orders: -     Ambulatory referral to Orthopedic Surgery  Other orders -     predniSONE (DELTASONE) 10 MG tablet; Take 5 daily for 3 days followed by 4,3,2 and 1 for 3 days each.   I have discontinued Ms. Derstine's oxyCODONE-acetaminophen, budesonide, predniSONE, and amoxicillin. I am also having her start on predniSONE. Additionally, I am having her maintain her etonogestrel-ethinyl estradiol and cetirizine. We will stop administering fluticasone. Additionally, we will continue to administer fluticasone.  Meds ordered this encounter  Medications  . predniSONE (DELTASONE) 10 MG tablet    Sig: Take 5 daily for 3 days followed by 4,3,2 and 1 for 3 days each.    Dispense:  45 tablet    Refill:  0     Follow-up: Return in about 6 months (around 10/17/2015).  Claretta Fraise, M.D.

## 2015-04-17 ENCOUNTER — Other Ambulatory Visit: Payer: Self-pay | Admitting: *Deleted

## 2015-04-17 DIAGNOSIS — J45901 Unspecified asthma with (acute) exacerbation: Secondary | ICD-10-CM

## 2015-04-17 LAB — CMP14+EGFR
A/G RATIO: 1.4 (ref 1.1–2.5)
ALK PHOS: 91 IU/L (ref 39–117)
ALT: 14 IU/L (ref 0–32)
AST: 15 IU/L (ref 0–40)
Albumin: 3.9 g/dL (ref 3.5–5.5)
BILIRUBIN TOTAL: 0.2 mg/dL (ref 0.0–1.2)
BUN/Creatinine Ratio: 12 (ref 8–20)
BUN: 11 mg/dL (ref 6–20)
CO2: 24 mmol/L (ref 18–29)
Calcium: 8.3 mg/dL — ABNORMAL LOW (ref 8.7–10.2)
Chloride: 104 mmol/L (ref 97–108)
Creatinine, Ser: 0.9 mg/dL (ref 0.57–1.00)
GFR calc Af Amer: 101 mL/min/{1.73_m2} (ref >59)
GFR, EST NON AFRICAN AMERICAN: 88 mL/min/{1.73_m2} (ref >59)
GLOBULIN, TOTAL: 2.8 g/dL (ref 1.5–4.5)
Glucose: 74 mg/dL (ref 65–99)
Potassium: 3.9 mmol/L (ref 3.5–5.2)
Sodium: 140 mmol/L (ref 134–144)
Total Protein: 6.7 g/dL (ref 6.0–8.5)

## 2015-04-17 LAB — D-DIMER, QUANTITATIVE: D-DIMER: 0.34 mg/L FEU (ref 0.00–0.49)

## 2015-04-17 MED ORDER — BUDESONIDE-FORMOTEROL FUMARATE 160-4.5 MCG/ACT IN AERO: 2.0000 | INHALATION_SPRAY | Freq: Two times a day (BID) | RESPIRATORY_TRACT | Status: DC

## 2015-04-17 MED ORDER — MONTELUKAST SODIUM 10 MG PO TABS: 10.0000 mg | ORAL_TABLET | Freq: Every day | ORAL | Status: DC

## 2015-04-17 MED ORDER — ALBUTEROL SULFATE (2.5 MG/3ML) 0.083% IN NEBU: 2.5000 mg | INHALATION_SOLUTION | Freq: Four times a day (QID) | RESPIRATORY_TRACT | Status: DC | PRN

## 2015-04-17 MED ORDER — ALBUTEROL SULFATE HFA 108 (90 BASE) MCG/ACT IN AERS: 2.0000 | INHALATION_SPRAY | Freq: Four times a day (QID) | RESPIRATORY_TRACT | Status: DC | PRN

## 2015-04-17 MED ORDER — CYCLOBENZAPRINE HCL 10 MG PO TABS: 10.0000 mg | ORAL_TABLET | Freq: Three times a day (TID) | ORAL | Status: DC | PRN

## 2015-04-17 NOTE — Progress Notes (Signed)
Refills from visit on 04/16/2015 Okayed per Dr Livia Snellen

## 2015-04-21 ENCOUNTER — Encounter: Payer: Self-pay | Admitting: *Deleted

## 2015-06-02 ENCOUNTER — Telehealth: Payer: Self-pay | Admitting: Family Medicine

## 2015-06-03 NOTE — Telephone Encounter (Signed)
Notes printed and patient notified to pick up.

## 2015-06-10 ENCOUNTER — Ambulatory Visit (INDEPENDENT_AMBULATORY_CARE_PROVIDER_SITE_OTHER): Payer: Medicaid Other | Admitting: Advanced Practice Midwife

## 2015-06-10 ENCOUNTER — Other Ambulatory Visit (HOSPITAL_COMMUNITY)
Admission: RE | Admit: 2015-06-10 | Discharge: 2015-06-10 | Disposition: A | Discharge status: Home / Self Care | Payer: Medicaid Other | Source: Ambulatory Visit | Attending: Advanced Practice Midwife | Admitting: Advanced Practice Midwife

## 2015-06-10 ENCOUNTER — Encounter: Payer: Self-pay | Admitting: Advanced Practice Midwife

## 2015-06-10 VITALS — BP 130/62 | HR 68 | Ht 62.0 in | Wt 159.5 lb

## 2015-06-10 DIAGNOSIS — Z113 Encounter for screening for infections with a predominantly sexual mode of transmission: Secondary | ICD-10-CM

## 2015-06-10 DIAGNOSIS — Z3202 Encounter for pregnancy test, result negative: Secondary | ICD-10-CM | POA: Diagnosis not present

## 2015-06-10 DIAGNOSIS — Z01419 Encounter for gynecological examination (general) (routine) without abnormal findings: Secondary | ICD-10-CM | POA: Insufficient documentation

## 2015-06-10 DIAGNOSIS — Z0000 Encounter for general adult medical examination without abnormal findings: Secondary | ICD-10-CM | POA: Diagnosis not present

## 2015-06-10 LAB — POCT URINE PREGNANCY: PREG TEST UR: NEGATIVE

## 2015-06-10 MED ORDER — MEDROXYPROGESTERONE ACETATE 150 MG/ML IM SUSP: 150.0000 mg | INTRAMUSCULAR | Status: DC

## 2015-06-10 NOTE — Progress Notes (Signed)
Rebecca Delacruz 29 y.o.  Filed Vitals:   06/10/15 1517  BP: 130/62  Pulse: 68     Past Medical History: Past Medical History  Diagnosis Date  . Ovarian cyst   . Gonorrhea   . Trichomonas   . Constipation   . No pertinent past medical history   . Shortness of breath   . Back pain   . Asthma   . Scoliosis     Past Surgical History: Past Surgical History  Procedure Laterality Date  . No past surgeries    . Tubal ligation  10/15/2011    Procedure: POST PARTUM TUBAL LIGATION;  Surgeon: Jonnie Kind, MD;  Location: [facility];  Service: Gynecology;  Laterality: Bilateral;  Post partum tubal ligation bilateral with filshie clips    Family History: Family History  Problem Relation Age of Onset  . Diabetes Mother   . Anesthesia problems Neg Hx   . Hypotension Neg Hx   . Malignant hyperthermia Neg Hx   . Pseudochol deficiency Neg Hx     Social History: Social History  Substance Use Topics  . Smoking status: Current Every Day Smoker -- 0.50 packs/day for 10 years    Types: Cigarettes  . Smokeless tobacco: Never Used  . Alcohol Use: 0.0 oz/week    0 Standard drinks or equivalent per week     Comment: occassionally    Allergies:  Allergies  Allergen Reactions  . Aspirin Palpitations      Current outpatient prescriptions:  .  albuterol (PROVENTIL HFA;VENTOLIN HFA) 108 (90 BASE) MCG/ACT inhaler, Inhale 2 puffs into the lungs every 6 (six) hours as needed for wheezing., Disp: 18 g, Rfl: 6 .  albuterol (PROVENTIL) (2.5 MG/3ML) 0.083% nebulizer solution, Take 3 mLs (2.5 mg total) by nebulization every 6 (six) hours as needed for wheezing or shortness of breath., Disp: 150 mL, Rfl: 1 .  budesonide-formoterol (SYMBICORT) 160-4.5 MCG/ACT inhaler, Inhale 2 puffs into the lungs 2 (two) times daily., Disp: 1 Inhaler, Rfl: 3 .  cyclobenzaprine (FLEXERIL) 10 MG tablet, Take 1 tablet (10 mg total) by mouth 3 (three) times daily as needed for muscle spasms., Disp: 30 tablet, Rfl:  0 .  medroxyPROGESTERone (DEPO-PROVERA) 150 MG/ML injection, Inject 1 mL (150 mg total) into the muscle every 3 (three) months., Disp: 1 mL, Rfl: 3  Current facility-administered medications:  .  fluticasone (FLONASE) 50 MCG/ACT nasal spray 2 spray, 2 spray, Each Nare, Daily, Lysbeth Penner, FNP  History of Present Illness: Here for pap/physical with STD testing.  Interested in Depo for period control.  Had BTL 10/2011. Wants routine STD screening.     Review of Systems   Patient denies any headaches, blurred vision, shortness of breath, chest pain, abdominal pain, problems with bowel movements, urination, or intercourse.   Physical Exam: General:  Well developed, well nourished, no acute distress Skin:  Warm and dry Neck:  Midline trachea, normal thyroid Lungs; Clear to auscultation bilaterally Breast:  No dominant palpable mass, retraction, or nipple discharge Cardiovascular: Regular rate and rhythm Abdomen:  Soft, non tender, no hepatosplenomegaly Pelvic:  External genitalia is normal in appearance.  The vagina is normal in appearance.  The cervix is bulbous.  Uterus is felt to be normal size, shape, and contour.  No adnexal masses or tenderness noted.  Extremities:  No swelling or varicosities noted Psych:  No mood changes.     Impression:  Normal gyn exam     Plan: Depo sent to pharmacy HIV,  RPR, trich, gc/chl

## 2015-06-11 LAB — HIV ANTIBODY (ROUTINE TESTING W REFLEX): HIV SCREEN 4TH GENERATION: NONREACTIVE

## 2015-06-11 LAB — RPR: RPR Ser Ql: NONREACTIVE

## 2015-06-12 LAB — TRICHOMONAS VAGINALIS, PROBE AMP: Trich vag by NAA: NEGATIVE

## 2015-06-12 LAB — CYTOLOGY - PAP

## 2015-06-14 ENCOUNTER — Emergency Department (HOSPITAL_COMMUNITY): Payer: Medicaid Other

## 2015-06-14 ENCOUNTER — Emergency Department (HOSPITAL_COMMUNITY)
Admission: EM | Admit: 2015-06-14 | Discharge: 2015-06-14 | Disposition: A | Discharge status: Home / Self Care | Payer: Medicaid Other | Attending: Emergency Medicine | Admitting: Emergency Medicine

## 2015-06-14 ENCOUNTER — Encounter (HOSPITAL_COMMUNITY): Payer: Self-pay | Admitting: Emergency Medicine

## 2015-06-14 DIAGNOSIS — Z7951 Long term (current) use of inhaled steroids: Secondary | ICD-10-CM | POA: Diagnosis not present

## 2015-06-14 DIAGNOSIS — Z79899 Other long term (current) drug therapy: Secondary | ICD-10-CM | POA: Insufficient documentation

## 2015-06-14 DIAGNOSIS — Z8719 Personal history of other diseases of the digestive system: Secondary | ICD-10-CM | POA: Insufficient documentation

## 2015-06-14 DIAGNOSIS — Z720 Tobacco use: Secondary | ICD-10-CM | POA: Insufficient documentation

## 2015-06-14 DIAGNOSIS — J45909 Unspecified asthma, uncomplicated: Secondary | ICD-10-CM | POA: Diagnosis not present

## 2015-06-14 DIAGNOSIS — S63601A Unspecified sprain of right thumb, initial encounter: Secondary | ICD-10-CM | POA: Insufficient documentation

## 2015-06-14 DIAGNOSIS — Y9289 Other specified places as the place of occurrence of the external cause: Secondary | ICD-10-CM | POA: Insufficient documentation

## 2015-06-14 DIAGNOSIS — W231XXA Caught, crushed, jammed, or pinched between stationary objects, initial encounter: Secondary | ICD-10-CM | POA: Insufficient documentation

## 2015-06-14 DIAGNOSIS — Z8619 Personal history of other infectious and parasitic diseases: Secondary | ICD-10-CM | POA: Diagnosis not present

## 2015-06-14 DIAGNOSIS — Y998 Other external cause status: Secondary | ICD-10-CM | POA: Insufficient documentation

## 2015-06-14 DIAGNOSIS — Z8742 Personal history of other diseases of the female genital tract: Secondary | ICD-10-CM | POA: Diagnosis not present

## 2015-06-14 DIAGNOSIS — S6991XA Unspecified injury of right wrist, hand and finger(s), initial encounter: Secondary | ICD-10-CM | POA: Diagnosis present

## 2015-06-14 DIAGNOSIS — Y9389 Activity, other specified: Secondary | ICD-10-CM | POA: Diagnosis not present

## 2015-06-14 DIAGNOSIS — Z8739 Personal history of other diseases of the musculoskeletal system and connective tissue: Secondary | ICD-10-CM | POA: Diagnosis not present

## 2015-06-14 MED ORDER — ACETAMINOPHEN 500 MG PO TABS
ORAL_TABLET | ORAL | Status: AC
  Filled 2015-06-14: qty 2

## 2015-06-14 MED ORDER — TRAMADOL HCL 50 MG PO TABS
100.0000 mg | ORAL_TABLET | Freq: Once | ORAL | Status: DC
  Filled 2015-06-14: qty 2

## 2015-06-14 MED ORDER — ACETAMINOPHEN 500 MG PO TABS
ORAL_TABLET | ORAL | Status: AC
  Filled 2015-06-14: qty 1

## 2015-06-14 MED ORDER — ONDANSETRON HCL 4 MG PO TABS
4.0000 mg | ORAL_TABLET | Freq: Once | ORAL | Status: AC
  Administered 2015-06-14: 4 mg via ORAL
  Filled 2015-06-14: qty 1

## 2015-06-14 MED ORDER — ACETAMINOPHEN 500 MG PO TABS
1000.0000 mg | ORAL_TABLET | Freq: Once | ORAL | Status: AC
  Administered 2015-06-14: 1000 mg via ORAL

## 2015-06-14 NOTE — Discharge Instructions (Signed)
Please use the thumb splint for the next 7-10 days. Your x-ray is negative for fracture. Your examination suggested a strain/sprain of the thumb. Please see Dr. Lenna Gilford for additional evaluation and management if not improving. Use Tylenol extra strength for your soreness. Please keep your hand elevated above your heart is much as possible. Thumb Sprain Your exam shows you have a sprained thumb. This means the ligaments around the joint have been torn. Thumb sprains usually take 3-6 weeks to heal. However, severe, unstable sprains may need to be fixed surgically. Sometimes a small piece of bone is pulled off by the ligament. If this is not treated properly, a sprained thumb can lead to a painful, weak joint. Treatment helps reduce pain and shortens the period of disability. The thumb, and often the wrist, must remain splinted for the first 2-4 weeks to protect the joint. Keep your hand elevated and apply ice packs frequently to the injured area (20-30 minutes every 2-3 hours) for the next 2-4 days. This helps reduce swelling and control pain. Pain medicine may also be used for several days. Motion and strengthening exercises may later be prescribed for the joint to return to normal function. Be sure to see your doctor for follow-up because your thumb joint may require further support with splints, bandages or tape. Please see your doctor or go to the emergency room right away if you have increased pain despite proper treatment, or a numb, cold, or pale thumb. Document Released: 10/27/2004 Document Revised: 12/12/2011 Document Reviewed: 09/20/2008 [facility] Patient Information 2015 [place], [place]. This information is not intended to replace advice given to you by your health care provider. Make sure you discuss any questions you have with your health care provider.
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
## 2015-06-15 ENCOUNTER — Telehealth: Payer: Self-pay | Admitting: *Deleted

## 2015-06-15 NOTE — Telephone Encounter (Signed)
Detailed message left for patient that we need to schedule her for a ER follow up.

## 2015-06-23 ENCOUNTER — Ambulatory Visit: Payer: Medicaid Other | Admitting: Family Medicine

## 2015-06-23 ENCOUNTER — Telehealth: Payer: Self-pay | Admitting: Family Medicine

## 2015-06-23 NOTE — Telephone Encounter (Signed)
Patient aware since we did not order she would need an order from her GYN who prescribed the depo-provera.

## 2015-06-24 ENCOUNTER — Encounter: Payer: Self-pay | Admitting: Family

## 2015-10-08 ENCOUNTER — Other Ambulatory Visit: Payer: Medicaid Other

## 2015-10-08 ENCOUNTER — Ambulatory Visit: Payer: Medicaid Other

## 2015-10-12 ENCOUNTER — Ambulatory Visit: Payer: Medicaid Other

## 2015-10-12 ENCOUNTER — Other Ambulatory Visit: Payer: Medicaid Other

## 2015-10-15 ENCOUNTER — Other Ambulatory Visit: Payer: Medicaid Other

## 2015-10-15 ENCOUNTER — Ambulatory Visit: Payer: Medicaid Other

## 2015-10-31 ENCOUNTER — Emergency Department (HOSPITAL_COMMUNITY)
Admission: EM | Admit: 2015-10-31 | Discharge: 2015-10-31 | Disposition: A | Discharge status: Home / Self Care | Payer: Medicaid Other | Attending: Emergency Medicine | Admitting: Emergency Medicine

## 2015-10-31 ENCOUNTER — Encounter (HOSPITAL_COMMUNITY): Payer: Self-pay | Admitting: Emergency Medicine

## 2015-10-31 DIAGNOSIS — M419 Scoliosis, unspecified: Secondary | ICD-10-CM | POA: Diagnosis not present

## 2015-10-31 DIAGNOSIS — Z8619 Personal history of other infectious and parasitic diseases: Secondary | ICD-10-CM | POA: Insufficient documentation

## 2015-10-31 DIAGNOSIS — J0100 Acute maxillary sinusitis, unspecified: Secondary | ICD-10-CM

## 2015-10-31 DIAGNOSIS — Z79899 Other long term (current) drug therapy: Secondary | ICD-10-CM | POA: Insufficient documentation

## 2015-10-31 DIAGNOSIS — Z7951 Long term (current) use of inhaled steroids: Secondary | ICD-10-CM | POA: Insufficient documentation

## 2015-10-31 DIAGNOSIS — K029 Dental caries, unspecified: Secondary | ICD-10-CM | POA: Insufficient documentation

## 2015-10-31 DIAGNOSIS — J45909 Unspecified asthma, uncomplicated: Secondary | ICD-10-CM | POA: Diagnosis not present

## 2015-10-31 DIAGNOSIS — F17210 Nicotine dependence, cigarettes, uncomplicated: Secondary | ICD-10-CM | POA: Insufficient documentation

## 2015-10-31 DIAGNOSIS — Z8742 Personal history of other diseases of the female genital tract: Secondary | ICD-10-CM | POA: Insufficient documentation

## 2015-10-31 DIAGNOSIS — Z8719 Personal history of other diseases of the digestive system: Secondary | ICD-10-CM | POA: Insufficient documentation

## 2015-10-31 DIAGNOSIS — K0889 Other specified disorders of teeth and supporting structures: Secondary | ICD-10-CM

## 2015-10-31 DIAGNOSIS — R51 Headache: Secondary | ICD-10-CM | POA: Diagnosis present

## 2015-10-31 MED ORDER — ACETAMINOPHEN 325 MG PO TABS
650.0000 mg | ORAL_TABLET | Freq: Once | ORAL | Status: AC
  Administered 2015-10-31: 650 mg via ORAL
  Filled 2015-10-31: qty 2

## 2015-10-31 MED ORDER — AMOXICILLIN 500 MG PO CAPS: 500.0000 mg | ORAL_CAPSULE | Freq: Three times a day (TID) | ORAL | Status: DC

## 2015-10-31 NOTE — ED Provider Notes (Signed)
CSN: [id number]     Arrival date & time 10/31/15  0134 History   First MD Initiated Contact with Patient 10/31/15 0150     Chief Complaint  Patient presents with  . Headache    HPI  Patient presents to the emergency room with complaints of left-sided headache that started about a week or 2 ago. Patient has had trouble with sinus congestion for the past 2 weeks. The symptoms have persisted. Tonight she was feeling worse so she decided coming to the hospital. She has felt congestion. She has pressure on both sides but primarily on the left. She has also had some pain with chewing and discomfort in the left upper molar region. She denies any difficulty with swallowing. No fevers or chills. No chest pain abdominal pain or shortness of breath. No numbness or weakness. No neck stiffness. Past Medical History  Diagnosis Date  . Ovarian cyst   . Gonorrhea   . Trichomonas   . Constipation   . No pertinent past medical history   . Shortness of breath   . Back pain   . Asthma   . Scoliosis    Past Surgical History  Procedure Laterality Date  . No past surgeries    . Tubal ligation  10/15/2011    Procedure: POST PARTUM TUBAL LIGATION;  Surgeon: Jonnie Kind, MD;  Location: [facility];  Service: Gynecology;  Laterality: Bilateral;  Post partum tubal ligation bilateral with filshie clips   Family History  Problem Relation Age of Onset  . Diabetes Mother   . Anesthesia problems Neg Hx   . Hypotension Neg Hx   . Malignant hyperthermia Neg Hx   . Pseudochol deficiency Neg Hx    Social History  Substance Use Topics  . Smoking status: Current Every Day Smoker -- 0.50 packs/day for 10 years    Types: Cigarettes  . Smokeless tobacco: Never Used  . Alcohol Use: 0.0 oz/week    0 Standard drinks or equivalent per week     Comment: last use was today, pt reports she drank 1 can of beer.    OB History    Gravida Para Term Preterm AB TAB SAB Ectopic Multiple Living   5 4 3 1 1     3       Obstetric  Comments   Pt reported Abruption as part of del of [place], in 2010, but records deny any mention of abruption.  Pt had known FDIU prior to presentation in active labor, delivering promptly upon arrival  Urine Drug screen neg at time of del.     Review of Systems  All other systems reviewed and are negative.     Allergies  Aspirin  Home Medications   Prior to Admission medications   Medication Sig Start Date End Date Taking? Authorizing Provider  albuterol (PROVENTIL HFA;VENTOLIN HFA) 108 (90 BASE) MCG/ACT inhaler Inhale 2 puffs into the lungs every 6 (six) hours as needed for wheezing. 04/17/15   Claretta Fraise, MD  albuterol (PROVENTIL) (2.5 MG/3ML) 0.083% nebulizer solution Take 3 mLs (2.5 mg total) by nebulization every 6 (six) hours as needed for wheezing or shortness of breath. 04/17/15   Claretta Fraise, MD  amoxicillin (AMOXIL) 500 MG capsule Take 1 capsule (500 mg total) by mouth 3 (three) times daily. 10/31/15   Dorie Rank, MD  budesonide-formoterol [facility]) 160-4.5 MCG/ACT inhaler Inhale 2 puffs into the lungs 2 (two) times daily. 04/17/15   Claretta Fraise, MD  cyclobenzaprine (FLEXERIL) 10 MG tablet Take  1 tablet (10 mg total) by mouth 3 (three) times daily as needed for muscle spasms. 04/17/15   Claretta Fraise, MD  medroxyPROGESTERone (DEPO-PROVERA) 150 MG/ML injection Inject 1 mL (150 mg total) into the muscle every 3 (three) months. 06/10/15   Christin Fudge, CNM   BP 99/66 mmHg  Pulse 97  Temp(Src) 98.2 F (36.8 C) (Oral)  Resp 18  Ht 5\' 3"  (1.6 m)  Wt 72.576 kg  BMI 28.35 kg/m2  SpO2 100%  LMP 10/24/2015 Physical Exam  Constitutional: She appears well-developed and well-nourished. No distress.  HENT:  Head: Normocephalic and atraumatic.  Right Ear: External ear normal.  Left Ear: External ear normal.  Mild sinus tenderness left side of the face, no purulent nasal drainage, normal tympanic membranes bilaterally, dental caries noted in the left maxillary molars,  gingivitis, no erythema, no abscess  Eyes: Conjunctivae are normal. Right eye exhibits no discharge. Left eye exhibits no discharge. No scleral icterus.  Neck: Neck supple. No tracheal deviation present.  Cardiovascular: Normal rate, regular rhythm and intact distal pulses.   Pulmonary/Chest: Effort normal and breath sounds normal. No stridor. No respiratory distress. She has no wheezes. She has no rales.  Abdominal: Soft. Bowel sounds are normal. She exhibits no distension. There is no tenderness. There is no rebound and no guarding.  Musculoskeletal: She exhibits no edema or tenderness.  Neurological: She is alert. She has normal strength. No cranial nerve deficit (no facial droop, extraocular movements intact, no slurred speech) or sensory deficit. She exhibits normal muscle tone. She displays no seizure activity. Coordination normal.  Skin: Skin is warm and dry. No rash noted.  Psychiatric: She has a normal mood and affect.  Nursing note and vitals reviewed.   ED Course  Procedures (including critical care time)   MDM   Final diagnoses:  Acute maxillary sinusitis, recurrence not specified  Toothache    The patient has a reassuring exam. She may have a mild sinusitis causing some of her persistent symptoms. She also is having some dental discomfort. There is no obvious abscess or drainage. She does not have any facial swelling. Because of Her persistent symptoms for the last 2 weeks I'll give her prescription for amoxicillin. I recommended follow-up with the dentist.    Dorie Rank, MD 10/31/15 [phone]

## 2015-10-31 NOTE — Discharge Instructions (Signed)

## 2015-10-31 NOTE — ED Notes (Addendum)
Per EMS pt is c/o headache and left sided facial soreness and swelling. EMS reports she has been drinking tonight, pt states ingestion of one beer. Pt states she has been congested for the past two weeks and states upper left dental pain, she states headache is on the left side of her head. Pt denies N/V

## 2015-11-12 ENCOUNTER — Ambulatory Visit: Payer: Medicaid Other | Admitting: Advanced Practice Midwife

## 2015-11-19 ENCOUNTER — Encounter: Payer: Self-pay | Admitting: Advanced Practice Midwife

## 2015-11-19 ENCOUNTER — Ambulatory Visit: Payer: Medicaid Other | Admitting: Advanced Practice Midwife

## 2016-01-01 ENCOUNTER — Emergency Department (HOSPITAL_COMMUNITY)
Admission: EM | Admit: 2016-01-01 | Discharge: 2016-01-01 | Disposition: A | Discharge status: Home / Self Care | Payer: Medicaid Other | Attending: Emergency Medicine | Admitting: Emergency Medicine

## 2016-01-01 ENCOUNTER — Encounter (HOSPITAL_COMMUNITY): Payer: Self-pay | Admitting: *Deleted

## 2016-01-01 DIAGNOSIS — M7741 Metatarsalgia, right foot: Secondary | ICD-10-CM | POA: Insufficient documentation

## 2016-01-01 DIAGNOSIS — J45909 Unspecified asthma, uncomplicated: Secondary | ICD-10-CM | POA: Diagnosis not present

## 2016-01-01 DIAGNOSIS — M79671 Pain in right foot: Secondary | ICD-10-CM | POA: Diagnosis present

## 2016-01-01 DIAGNOSIS — F17210 Nicotine dependence, cigarettes, uncomplicated: Secondary | ICD-10-CM | POA: Insufficient documentation

## 2016-01-01 DIAGNOSIS — L84 Corns and callosities: Secondary | ICD-10-CM | POA: Diagnosis not present

## 2016-01-01 DIAGNOSIS — M7742 Metatarsalgia, left foot: Secondary | ICD-10-CM | POA: Insufficient documentation

## 2016-01-01 DIAGNOSIS — Z79899 Other long term (current) drug therapy: Secondary | ICD-10-CM | POA: Insufficient documentation

## 2016-01-01 MED ORDER — IBUPROFEN 400 MG PO TABS
600.0000 mg | ORAL_TABLET | Freq: Once | ORAL | Status: AC
  Administered 2016-01-01: 600 mg via ORAL
  Filled 2016-01-01: qty 2

## 2016-01-01 NOTE — ED Notes (Signed)
Pt complaining of bilateral pain in her feet for the past 3 weeks and has pain at least 3 days a week.

## 2016-01-01 NOTE — Discharge Instructions (Signed)
You need to try to get shoes that fit your feet properly and gave you more support. You can follow-up with Dr. Caprice Beaver, a podiatrist or consider going to [employer] store in [place] to be fitted. Take either ibuprofen 600 mg 4 times a day for pain OR aleve 2 tabs twice a day for pain.    Corns and Calluses Corns are small areas of thickened skin that occur on the top, sides, or tip of a toe. They contain a cone-shaped core with a point that can press on a nerve below. This causes pain. Calluses are areas of thickened skin that can occur anywhere on the body including hands, fingers, palms, soles of the feet, and heels.Calluses are usually larger than corns.  CAUSES  Corns and calluses are caused by rubbing (friction) or pressure, such as from shoes that are too tight or do not fit properly.  RISK FACTORS Corns are more likely to develop in people who have toe deformities, such as hammer toes. Since calluses can occur with friction to any area of the skin, calluses are more likely to develop in people who:   Work with their hands.  Wear shoes that fit poorly, shoes that are too tight, or shoes that are high-heeled.  Have toes deformities. SYMPTOMS Symptoms of a corn or callus include:  A hard growth on the skin.   Pain or tenderness under the skin.   Redness and swelling.   Increased discomfort while wearing tight-fitting shoes. DIAGNOSIS  Corns and calluses may be diagnosed with a medical history and physical exam.  TREATMENT  Corns and calluses may be treated with:  Removing the cause of the friction or pressure. This may include:  Changing your shoes.  Wearing shoe inserts (orthotics) or other protective layers in your shoes, such as a corn pad.  Wearing gloves.  Medicines to help soften skin in the hardened, thickened areas.  Reducing the size of the corn or callus by removing the dead layers of skin.  Antibiotic medicines to treat infection.  Surgery, if a  toe deformity is the cause. HOME CARE INSTRUCTIONS   Take medicines only as directed by your health care provider.  If you were prescribed an antibiotic, finish all of it even if you start to feel better.  Wear shoes that fit well. Avoid wearing high-heeled shoes and shoes that are too tight or too loose.  Wear any padding, protective layers, gloves, or orthotics as directed by your health care provider.  Soak your hands or feet and then use a file or pumice stone to soften your corn or callus. Do this as directed by your health care provider.  Check your corn or callus every day for signs of infection. Watch for:  Redness, swelling, or pain.  Fluid, blood, or pus. SEEK MEDICAL CARE IF:   Your symptoms do not improve with treatment.  You have increased redness, swelling, or pain at the site of your corn or callus.  You have fluid, blood, or pus coming from your corn or callus.  You have new symptoms.   This information is not intended to replace advice given to you by your health care provider. Make sure you discuss any questions you have with your health care provider.   Document Released: 06/25/2004 Document Revised: 02/03/2015 Document Reviewed: 09/15/2014 Elsevier Interactive Patient Education [employer].

## 2016-01-01 NOTE — ED Provider Notes (Signed)
CSN: [id number]     Arrival date & time 01/01/16  0229 History   First MD Initiated Contact with Patient 01/01/16 0255     Chief Complaint  Patient presents with  . Foot Pain     (Consider location/radiation/quality/duration/timing/severity/associated sxs/prior Treatment) HPI   patient states about 4 months ago she started a new job and she is on her feet for about 8 hours a day. She states she wears [place] [employer] [place] tennis shoes but states they are very flat soled. She reports she has been having calluses on her feet for years but they are getting worse. She states for the past 3 weeks for several nights a week she is awakened from sleep from pain in her feet. She states the bottom of her feet hurt and ache. She has not tried any medications.  [facility] in [place]   Past Medical History  Diagnosis Date  . Ovarian cyst   . Gonorrhea   . Trichomonas   . Constipation   . No pertinent past medical history   . Shortness of breath   . Back pain   . Asthma   . Scoliosis    Past Surgical History  Procedure Laterality Date  . No past surgeries    . Tubal ligation  10/15/2011    Procedure: POST PARTUM TUBAL LIGATION;  Surgeon: Jonnie Kind, MD;  Location: [facility];  Service: Gynecology;  Laterality: Bilateral;  Post partum tubal ligation bilateral with filshie clips   Family History  Problem Relation Age of Onset  . Diabetes Mother   . Anesthesia problems Neg Hx   . Hypotension Neg Hx   . Malignant hyperthermia Neg Hx   . Pseudochol deficiency Neg Hx    Social History  Substance Use Topics  . Smoking status: Current Every Day Smoker -- 0.50 packs/day for 10 years    Types: Cigarettes  . Smokeless tobacco: Never Used  . Alcohol Use: 0.0 oz/week    0 Standard drinks or equivalent per week     Comment: last use was today, pt reports she drank 1 can of beer.    Employed Smokes 1 ppd  OB History    Gravida Para Term Preterm AB TAB SAB Ectopic Multiple Living   5  4 3 1 1     3       Obstetric Comments   Pt reported Abruption as part of del of [place], in 2010, but records deny any mention of abruption.  Pt had known FDIU prior to presentation in active labor, delivering promptly upon arrival  Urine Drug screen neg at time of del.     Review of Systems  All other systems reviewed and are negative.     Allergies  Aspirin  Home Medications   Prior to Admission medications   Medication Sig Start Date End Date Taking? Authorizing Provider  albuterol (PROVENTIL HFA;VENTOLIN HFA) 108 (90 BASE) MCG/ACT inhaler Inhale 2 puffs into the lungs every 6 (six) hours as needed for wheezing. 04/17/15  Yes Claretta Fraise, MD  albuterol (PROVENTIL) (2.5 MG/3ML) 0.083% nebulizer solution Take 3 mLs (2.5 mg total) by nebulization every 6 (six) hours as needed for wheezing or shortness of breath. 04/17/15  Yes Claretta Fraise, MD  budesonide-formoterol [facility]) 160-4.5 MCG/ACT inhaler Inhale 2 puffs into the lungs 2 (two) times daily. 04/17/15  Yes Claretta Fraise, MD  cyclobenzaprine (FLEXERIL) 10 MG tablet Take 1 tablet (10 mg total) by mouth 3 (three) times daily as needed for  muscle spasms. 04/17/15  Yes Claretta Fraise, MD  medroxyPROGESTERone (DEPO-PROVERA) 150 MG/ML injection Inject 1 mL (150 mg total) into the muscle every 3 (three) months. 06/10/15  Yes Christin Fudge, CNM  amoxicillin (AMOXIL) 500 MG capsule Take 1 capsule (500 mg total) by mouth 3 (three) times daily. 10/31/15   Dorie Rank, MD   BP 112/67 mmHg  Pulse 90  Temp(Src) 98 F (36.7 C) (Oral)  Resp 18  Ht 5\' 2"  (1.575 m)  Wt 180 lb (81.647 kg)  BMI 32.91 kg/m2  SpO2 99%  LMP 12/02/2015  Vital signs normal   Physical Exam  Constitutional: She is oriented to person, place, and time. She appears well-developed and well-nourished.  Non-toxic appearance. She does not appear ill. No distress.  HENT:  Head: Normocephalic and atraumatic.  Right Ear: External ear normal.  Left Ear: External ear  normal.  Nose: Nose normal. No mucosal edema or rhinorrhea.  Mouth/Throat: Mucous membranes are normal. No dental abscesses or uvula swelling.  Eyes: Conjunctivae and EOM are normal.  Neck: Normal range of motion and full passive range of motion without pain.  Pulmonary/Chest: Effort normal. No respiratory distress. She has no rhonchi. She exhibits no crepitus.  Abdominal: Normal appearance.  Musculoskeletal: Normal range of motion. She exhibits tenderness. She exhibits no edema.  Moves all extremities well. Patient is noted to have lots of calluses on both her feet on the great toes, the heel and over her MTP joint of the little toes. She is tender to palpation over the MTP joints consistent with metatarsalgia.  Neurological: She is alert and oriented to person, place, and time. She has normal strength. No cranial nerve deficit.  Skin: Skin is warm, dry and intact. No rash noted. No erythema. No pallor.  Psychiatric: She has a normal mood and affect. Her speech is normal and behavior is normal. Her mood appears not anxious.  Nursing note and vitals reviewed.               ED Course  Procedures (including critical care time)  We discussed the calluses are signed at her shoes do not fit her feet correctly and the fact that her shoes are very flat indicates they do not have any support for her feet now that she's working and standing for prolonged periods of time. I'm going to refer her to a podiatrist and she was advised to take anti-inflammatory medications for pain such as ibuprofen or Aleve. She can use ice and heat for comfort.   MDM   Final diagnoses:  Metatarsalgia of both feet  Corns and callus    Plan discharge  Rolland Porter, MD, Barbette Or, MD 01/01/16 [phone]

## 2016-01-13 ENCOUNTER — Telehealth: Payer: Self-pay | Admitting: Family Medicine

## 2016-01-13 ENCOUNTER — Other Ambulatory Visit: Payer: Self-pay | Admitting: Family

## 2016-01-13 DIAGNOSIS — J45901 Unspecified asthma with (acute) exacerbation: Secondary | ICD-10-CM

## 2016-01-13 NOTE — Telephone Encounter (Signed)
Nebulizer machine phoned in to patient

## 2016-01-13 NOTE — Telephone Encounter (Signed)
Please call in as requested 

## 2016-01-14 ENCOUNTER — Telehealth: Payer: Self-pay | Admitting: Family

## 2016-01-14 NOTE — Telephone Encounter (Signed)
Paper order sent back to Dr Livia Snellen to sign

## 2016-01-17 ENCOUNTER — Emergency Department (HOSPITAL_COMMUNITY)
Admission: EM | Admit: 2016-01-17 | Discharge: 2016-01-17 | Disposition: A | Discharge status: Home / Self Care | Payer: Medicaid Other | Attending: Emergency Medicine | Admitting: Emergency Medicine

## 2016-01-17 ENCOUNTER — Encounter (HOSPITAL_COMMUNITY): Payer: Self-pay | Admitting: Cardiology

## 2016-01-17 DIAGNOSIS — J45909 Unspecified asthma, uncomplicated: Secondary | ICD-10-CM | POA: Diagnosis not present

## 2016-01-17 DIAGNOSIS — R1032 Left lower quadrant pain: Secondary | ICD-10-CM | POA: Diagnosis present

## 2016-01-17 DIAGNOSIS — Z7951 Long term (current) use of inhaled steroids: Secondary | ICD-10-CM | POA: Insufficient documentation

## 2016-01-17 DIAGNOSIS — R102 Pelvic and perineal pain: Secondary | ICD-10-CM

## 2016-01-17 DIAGNOSIS — F17210 Nicotine dependence, cigarettes, uncomplicated: Secondary | ICD-10-CM | POA: Insufficient documentation

## 2016-01-17 LAB — WET PREP, GENITAL
SPERM: NONE SEEN
Trich, Wet Prep: NONE SEEN
Yeast Wet Prep HPF POC: NONE SEEN

## 2016-01-17 LAB — CBC WITH DIFFERENTIAL/PLATELET
BASOS ABS: 0 10*3/uL (ref 0.0–0.1)
BASOS PCT: 0 %
EOS PCT: 1 %
Eosinophils Absolute: 0.1 10*3/uL (ref 0.0–0.7)
HCT: 41 % (ref 36.0–46.0)
Hemoglobin: 13.8 g/dL (ref 12.0–15.0)
LYMPHS PCT: 14 %
Lymphs Abs: 1.3 10*3/uL (ref 0.7–4.0)
MCH: 30.6 pg (ref 26.0–34.0)
MCHC: 33.7 g/dL (ref 30.0–36.0)
MCV: 90.9 fL (ref 78.0–100.0)
Monocytes Absolute: 0.4 10*3/uL (ref 0.1–1.0)
Monocytes Relative: 4 %
NEUTROS ABS: 7.8 10*3/uL — AB (ref 1.7–7.7)
Neutrophils Relative %: 81 %
Platelets: 239 10*3/uL (ref 150–400)
RBC: 4.51 MIL/uL (ref 3.87–5.11)
RDW: 12.9 % (ref 11.5–15.5)
WBC: 9.6 10*3/uL (ref 4.0–10.5)

## 2016-01-17 LAB — URINALYSIS, ROUTINE W REFLEX MICROSCOPIC
BILIRUBIN URINE: NEGATIVE
Glucose, UA: NEGATIVE mg/dL
HGB URINE DIPSTICK: NEGATIVE
Ketones, ur: NEGATIVE mg/dL
Leukocytes, UA: NEGATIVE
Nitrite: NEGATIVE
PH: 8.5 — AB (ref 5.0–8.0)
SPECIFIC GRAVITY, URINE: 1.01 (ref 1.005–1.030)

## 2016-01-17 LAB — BASIC METABOLIC PANEL
ANION GAP: 8 (ref 5–15)
BUN: 9 mg/dL (ref 6–20)
CALCIUM: 8.4 mg/dL — AB (ref 8.9–10.3)
CO2: 25 mmol/L (ref 22–32)
Chloride: 106 mmol/L (ref 101–111)
Creatinine, Ser: 0.77 mg/dL (ref 0.44–1.00)
GFR calc Af Amer: >60 mL/min (ref >60)
Glucose, Bld: 92 mg/dL (ref 65–99)
POTASSIUM: 3.7 mmol/L (ref 3.5–5.1)
SODIUM: 139 mmol/L (ref 135–145)

## 2016-01-17 LAB — PREGNANCY, URINE: Preg Test, Ur: NEGATIVE

## 2016-01-17 LAB — URINE MICROSCOPIC-ADD ON
BACTERIA UA: NONE SEEN
RBC / HPF: NONE SEEN RBC/hpf (ref 0–5)
WBC, UA: NONE SEEN WBC/hpf (ref 0–5)

## 2016-01-17 MED ORDER — HYDROCODONE-ACETAMINOPHEN 5-325 MG PO TABS: 1.0000 | ORAL_TABLET | ORAL | Status: DC | PRN

## 2016-01-17 MED ORDER — HYDROCODONE-ACETAMINOPHEN 5-325 MG PO TABS
1.0000 | ORAL_TABLET | Freq: Once | ORAL | Status: AC
  Administered 2016-01-17: 1 via ORAL
  Filled 2016-01-17: qty 1

## 2016-01-17 NOTE — ED Provider Notes (Signed)
CSN: [id number]     Arrival date & time 01/17/16  1645 History   First MD Initiated Contact with Patient 01/17/16 1658     Chief Complaint  Patient presents with  . Abdominal Pain     (Consider location/radiation/quality/duration/timing/severity/associated sxs/prior Treatment) HPI   Rebecca Delacruz is a 30 y.o. female who presents for evaluation of left lower quadrant abdominal pain. Pain is present for 3 days, and reminds her of pain when she had a ovarian cyst several years ago. She does have regular menses, and is status post bilateral tubal ligation. Last menstrual cycle was about 3 weeks ago. She has had some nausea and vomiting, today. She denies fever, chills,constipation or diarrhea. Last bowel movement was this morning and normal. She has not seen her gynecologist recently. Her prior ovarian cyst, improved after several months, and an unknown treatment given to her by her gynecologist. There are no other no modifying factors.   Past Medical History  Diagnosis Date  . Ovarian cyst   . Gonorrhea   . Trichomonas   . Constipation   . No pertinent past medical history   . Shortness of breath   . Back pain   . Asthma   . Scoliosis    Past Surgical History  Procedure Laterality Date  . No past surgeries    . Tubal ligation  10/15/2011    Procedure: POST PARTUM TUBAL LIGATION;  Surgeon: Jonnie Kind, MD;  Location: [facility];  Service: Gynecology;  Laterality: Bilateral;  Post partum tubal ligation bilateral with filshie clips   Family History  Problem Relation Age of Onset  . Diabetes Mother   . Anesthesia problems Neg Hx   . Hypotension Neg Hx   . Malignant hyperthermia Neg Hx   . Pseudochol deficiency Neg Hx    Social History  Substance Use Topics  . Smoking status: Current Every Day Smoker -- 0.50 packs/day for 10 years    Types: Cigarettes  . Smokeless tobacco: Never Used  . Alcohol Use: 0.0 oz/week    0 Standard drinks or equivalent per week     Comment: last use  was today, pt reports she drank 1 can of beer.    OB History    Gravida Para Term Preterm AB TAB SAB Ectopic Multiple Living   5 4 3 1 1     3       Obstetric Comments   Pt reported Abruption as part of del of [place], in 2010, but records deny any mention of abruption.  Pt had known FDIU prior to presentation in active labor, delivering promptly upon arrival  Urine Drug screen neg at time of del.     Review of Systems  All other systems reviewed and are negative.     Allergies  Aspirin  Home Medications   Prior to Admission medications   Medication Sig Start Date End Date Taking? Authorizing Provider  albuterol (PROVENTIL HFA;VENTOLIN HFA) 108 (90 BASE) MCG/ACT inhaler Inhale 2 puffs into the lungs every 6 (six) hours as needed for wheezing. 04/17/15  Yes Claretta Fraise, MD  albuterol (PROVENTIL) (2.5 MG/3ML) 0.083% nebulizer solution Take 3 mLs (2.5 mg total) by nebulization every 6 (six) hours as needed for wheezing or shortness of breath. 04/17/15  Yes Claretta Fraise, MD  budesonide-formoterol [facility]) 160-4.5 MCG/ACT inhaler Inhale 2 puffs into the lungs 2 (two) times daily. 04/17/15  Yes Claretta Fraise, MD  HYDROcodone-acetaminophen (NORCO/VICODIN) 5-325 MG tablet Take 1-2 tablets by mouth every 4 (four)  hours as needed. 01/17/16   Daleen Bo, MD   LMP 12/02/2015 Physical Exam  Constitutional: She is oriented to person, place, and time. She appears well-developed and well-nourished.  HENT:  Head: Normocephalic and atraumatic.  Right Ear: External ear normal.  Left Ear: External ear normal.  Eyes: Conjunctivae and EOM are normal. Pupils are equal, round, and reactive to light.  Neck: Normal range of motion and phonation normal. Neck supple.  Cardiovascular: Normal rate, regular rhythm and normal heart sounds.   Pulmonary/Chest: Effort normal and breath sounds normal. She exhibits no bony tenderness.  Abdominal: Soft. There is no tenderness.  Genitourinary:  Normal external  female genitalia. Small amount of thin white opaque vaginal discharge. She has mild cervical motion tenderness. Unable to palpate uterus. There is no palpable adnexal mass or tenderness.  Musculoskeletal: Normal range of motion.  Neurological: She is alert and oriented to person, place, and time. No cranial nerve deficit or sensory deficit. She exhibits normal muscle tone. Coordination normal.  Skin: Skin is warm, dry and intact.  Psychiatric: She has a normal mood and affect. Her behavior is normal. Judgment and thought content normal.  Nursing note and vitals reviewed.   ED Course  Procedures (including critical care time)  Initial clinical impression- nonspecific left lower quadrant pain, differential includes recurrent ovarian cyst, PID, constipation and urinary tract infection. We'll evaluate labs, pelvic examination, then reassess  Medications  HYDROcodone-acetaminophen (NORCO/VICODIN) 5-325 MG per tablet 1 tablet (1 tablet Oral Given 01/17/16 1742)    No data found.   6:58 PM Reevaluation with update and discussion. After initial assessment and treatment, an updated evaluation reveals She is comfortable at this time. Findings discussed with patient, all questions answered. She understands that she will need to return tomorrow morning for pelvic ultrasound to be evaluated for ovarian cyst.. Glendell Fouse L    Labs Review Labs Reviewed  WET PREP, GENITAL - Abnormal; Notable for the following:    Clue Cells Wet Prep HPF POC PRESENT (*)    WBC, Wet Prep HPF POC FEW (*)    All other components within normal limits  URINALYSIS, ROUTINE W REFLEX MICROSCOPIC (NOT AT [facility]) - Abnormal; Notable for the following:    pH 8.5 (*)    Protein, ur TRACE (*)    All other components within normal limits  URINE MICROSCOPIC-ADD ON - Abnormal; Notable for the following:    Squamous Epithelial / LPF 0-5 (*)    All other components within normal limits  BASIC METABOLIC PANEL - Abnormal; Notable for  the following:    Calcium 8.4 (*)    All other components within normal limits  CBC WITH DIFFERENTIAL/PLATELET - Abnormal; Notable for the following:    Neutro Abs 7.8 (*)    All other components within normal limits  PREGNANCY, URINE  RPR  HIV ANTIBODY (ROUTINE TESTING)  GC/CHLAMYDIA PROBE AMP ([place]) NOT AT [facility]    Imaging Review No results found. I have personally reviewed and evaluated these images and lab results as part of my medical decision-making.   EKG Interpretation None      MDM   Final diagnoses:  Pelvic pain in female    Nonspecific pelvic pain in a patient with prior ovarian cyst. Doubt PID, ovarian torsion, pregnancy, or enteritis.   Nursing Notes Reviewed/ Care Coordinated Applicable Imaging Reviewed Interpretation of Laboratory Data incorporated into ED treatment  The patient appears reasonably screened and/or stabilized for discharge and I doubt any other medical condition or  other [place] requiring further screening, evaluation, or treatment in the ED at this time prior to discharge.  Plan: Home Medications- Norco prepack to go; Home Treatments- rest; return here if the recommended treatment, does not improve the symptoms; Recommended follow up- return tomorrow morning for pelvic ultrasound to be evaluated for ovarian cyst   Daleen Bo, MD 01/17/16 1859

## 2016-01-17 NOTE — ED Notes (Signed)
Pt back and forth to desk and in the hall- [employer] [place] per instruct. Pt verbalizes understanding and reports that she will follow with GYN rather than [employer]. She is given her instructions and discharged as she reports she is hungry

## 2016-01-17 NOTE — ED Notes (Signed)
Abdominal pain,  Vomiting times 3 days

## 2016-01-17 NOTE — Discharge Instructions (Signed)
Return to the radiology department in the morning for a pelvic ultrasound to be evaluated for ovarian cyst.   Pelvic Pain, Female Female pelvic pain can be caused by many different things and start from a variety of places. Pelvic pain refers to pain that is located in the lower half of the abdomen and between your hips. The pain may occur over a short period of time (acute) or may be reoccurring (chronic). The cause of pelvic pain may be related to disorders affecting the female reproductive organs (gynecologic), but it may also be related to the bladder, kidney stones, an intestinal complication, or muscle or skeletal problems. Getting help right away for pelvic pain is important, especially if there has been severe, sharp, or a sudden onset of unusual pain. It is also important to get help right away because some types of pelvic pain can be life threatening.  CAUSES  Below are only some of the causes of pelvic pain. The causes of pelvic pain can be in one of several categories.   Gynecologic.  Pelvic inflammatory disease.  Sexually transmitted infection.  Ovarian cyst or a twisted ovarian ligament (ovarian torsion).  Uterine lining that grows outside the uterus (endometriosis).  Fibroids, cysts, or tumors.  Ovulation.  Pregnancy.  Pregnancy that occurs outside the uterus (ectopic pregnancy).  Miscarriage.  Labor.  Abruption of the placenta or ruptured uterus.  Infection.  Uterine infection (endometritis).  Bladder infection.  Diverticulitis.  Miscarriage related to a uterine infection (septic abortion).  Bladder.  Inflammation of the bladder (cystitis).  Kidney stone(s).  Gastrointestinal.  Constipation.  Diverticulitis.  Neurologic.  Trauma.  Feeling pelvic pain because of mental or emotional causes (psychosomatic).  Cancers of the bowel or pelvis. EVALUATION  Your caregiver will want to take a careful history of your concerns. This includes recent  changes in your health, a careful gynecologic history of your periods (menses), and a sexual history. Obtaining your family history and medical history is also important. Your caregiver may suggest a pelvic exam. A pelvic exam will help identify the location and severity of the pain. It also helps in the evaluation of which organ system may be involved. In order to identify the cause of the pelvic pain and be properly treated, your caregiver may order tests. These tests may include:   A pregnancy test.  Pelvic ultrasonography.  An X-ray exam of the abdomen.  A urinalysis or evaluation of vaginal discharge.  Blood tests. HOME CARE INSTRUCTIONS   Only take over-the-counter or prescription medicines for pain, discomfort, or fever as directed by your caregiver.   Rest as directed by your caregiver.   Eat a balanced diet.   Drink enough fluids to make your urine clear or pale yellow, or as directed.   Avoid sexual intercourse if it causes pain.   Apply warm or cold compresses to the lower abdomen depending on which one helps the pain.   Avoid stressful situations.   Keep a journal of your pelvic pain. Write down when it started, where the pain is located, and if there are things that seem to be associated with the pain, such as food or your menstrual cycle.  Follow up with your caregiver as directed.  SEEK MEDICAL CARE IF:  Your medicine does not help your pain.  You have abnormal vaginal discharge. SEEK IMMEDIATE MEDICAL CARE IF:   You have heavy bleeding from the vagina.   Your pelvic pain increases.   You feel light-headed or faint.  You have chills.   You have pain with urination or blood in your urine.   You have uncontrolled diarrhea or vomiting.   You have a fever or persistent symptoms for more than 3 days.  You have a fever and your symptoms suddenly get worse.   You are being physically or sexually abused.   This information is not intended  to replace advice given to you by your health care provider. Make sure you discuss any questions you have with your health care provider.   Document Released: 08/16/2004 Document Revised: 06/10/2015 Document Reviewed: 01/09/2012 Elsevier Interactive Patient Education [employer].

## 2016-01-18 LAB — GC/CHLAMYDIA PROBE AMP (~~LOC~~) NOT AT ARMC
Chlamydia: NEGATIVE
Neisseria Gonorrhea: NEGATIVE

## 2016-01-19 LAB — RPR: RPR: NONREACTIVE

## 2016-01-19 LAB — HIV ANTIBODY (ROUTINE TESTING W REFLEX): HIV SCREEN 4TH GENERATION: NONREACTIVE

## 2016-01-19 MED FILL — Hydrocodone-Acetaminophen Tab 5-325 MG: ORAL | Qty: 6 | Status: AC

## 2016-04-14 ENCOUNTER — Ambulatory Visit (INDEPENDENT_AMBULATORY_CARE_PROVIDER_SITE_OTHER): Payer: Medicaid Other | Admitting: Advanced Practice Midwife

## 2016-04-14 ENCOUNTER — Encounter: Payer: Self-pay | Admitting: Advanced Practice Midwife

## 2016-04-14 VITALS — BP 114/70 | HR 62 | Ht 62.0 in | Wt 186.0 lb

## 2016-04-14 DIAGNOSIS — Z113 Encounter for screening for infections with a predominantly sexual mode of transmission: Secondary | ICD-10-CM

## 2016-04-14 NOTE — Progress Notes (Signed)
[facility] Visit  Patient name: Rebecca Delacruz MRN [id number]  Date of birth: 27-Jun-1986  CC & HPI:  Rebecca Delacruz is a 29 y.o.  female presenting today for STD screen. She had unprotected sex a week ago and is concerned. She had HIV/RPR 3 months ago.  Declines blood work Also, C/O LLQ pain for a few months, feels like "when I had a cyst".  Went to ED, told to get [place] w/GYN  Pertinent History Reviewed:  Medical & Surgical Hx:   Past Medical History  Diagnosis Date  . Ovarian cyst   . Gonorrhea   . Trichomonas   . Constipation   . No pertinent past medical history   . Shortness of breath   . Back pain   . Asthma   . Scoliosis    Past Surgical History  Procedure Laterality Date  . No past surgeries    . Tubal ligation  10/15/2011    Procedure: POST PARTUM TUBAL LIGATION;  Surgeon: Jonnie Kind, MD;  Location: [facility];  Service: Gynecology;  Laterality: Bilateral;  Post partum tubal ligation bilateral with filshie clips   Family History  Problem Relation Age of Onset  . Diabetes Mother   . Anesthesia problems Neg Hx   . Hypotension Neg Hx   . Malignant hyperthermia Neg Hx   . Pseudochol deficiency Neg Hx     Current outpatient prescriptions:  .  albuterol (PROVENTIL HFA;VENTOLIN HFA) 108 (90 BASE) MCG/ACT inhaler, Inhale 2 puffs into the lungs every 6 (six) hours as needed for wheezing., Disp: 18 g, Rfl: 6 .  albuterol (PROVENTIL) (2.5 MG/3ML) 0.083% nebulizer solution, Take 3 mLs (2.5 mg total) by nebulization every 6 (six) hours as needed for wheezing or shortness of breath., Disp: 150 mL, Rfl: 1 .  budesonide-formoterol (SYMBICORT) 160-4.5 MCG/ACT inhaler, Inhale 2 puffs into the lungs 2 (two) times daily., Disp: 1 Inhaler, Rfl: 3 .  HYDROcodone-acetaminophen (NORCO/VICODIN) 5-325 MG tablet, Take 1-2 tablets by mouth every 4 (four) hours as needed., Disp: 6 tablet, Rfl: 0  Current facility-administered medications:  .  fluticasone (FLONASE) 50 MCG/ACT  nasal spray 2 spray, 2 spray, Each Nare, Daily, Lysbeth Penner, FNP Social History: Reviewed -  reports that she has been smoking Cigarettes.  She has a 5 pack-year smoking history. She has never used smokeless tobacco.  Review of Systems:   Constitutional: Negative for fever and chills Eyes: Negative for visual disturbances Respiratory: Negative for shortness of breath, dyspnea Cardiovascular: Negative for chest pain or palpitations  Gastrointestinal: Negative for vomiting, diarrhea and constipation; no abdominal pain Genitourinary: Negative for dysuria and urgency, vaginal irritation or itching Musculoskeletal: Negative for back pain, joint pain, myalgias  Neurological: Negative for dizziness and headaches    Objective Findings:    Physical Examination: General appearance - well appearing, and in no distress Mental status - alert, oriented to person, place, and time Chest:  Normal respiratory effort Heart - normal rate and regular rhythm Abdomen:  Soft, nontender Pelvic: Normal appearing DC.  NuSwab collected.  LLQ tender to palpation Musculoskeletal:  Normal range of motion without pain Extremities:  No edema    No results found for this or any previous visit (from the past 24 hour(s)).    Assessment & Plan:  A:   STD Screen  ? Ovarian cyst P:  NuSwab    Return in about 1 week (around 04/21/2016), or pelvic US and provider visit after [place].  CRESENZO-DISHMAN,Barbarita Hutmacher CNM  04/14/2016 4:54 PM

## 2016-04-15 ENCOUNTER — Encounter: Payer: Self-pay | Admitting: Family

## 2016-04-15 ENCOUNTER — Ambulatory Visit (INDEPENDENT_AMBULATORY_CARE_PROVIDER_SITE_OTHER): Payer: Medicaid Other | Admitting: Family

## 2016-04-15 VITALS — BP 102/67 | HR 58 | Temp 98.2°F | Ht 62.0 in | Wt 184.6 lb

## 2016-04-15 DIAGNOSIS — E669 Obesity, unspecified: Secondary | ICD-10-CM | POA: Diagnosis not present

## 2016-04-15 DIAGNOSIS — S86811A Strain of other muscle(s) and tendon(s) at lower leg level, right leg, initial encounter: Secondary | ICD-10-CM | POA: Diagnosis not present

## 2016-04-15 DIAGNOSIS — S86911A Strain of unspecified muscle(s) and tendon(s) at lower leg level, right leg, initial encounter: Secondary | ICD-10-CM

## 2016-04-15 MED ORDER — PREDNISONE 10 MG (21) PO TBPK: 10.0000 mg | ORAL_TABLET | Freq: Every day | ORAL | Status: DC

## 2016-04-15 MED ORDER — NAPROXEN 500 MG PO TABS: 500.0000 mg | ORAL_TABLET | Freq: Two times a day (BID) | ORAL | Status: DC

## 2016-04-15 NOTE — Progress Notes (Signed)
   Subjective:    Patient ID: Rebecca Delacruz, female    DOB: Apr 06, 1986, 30 y.o.   MRN: [id number]  Knee Pain  The incident occurred 3 to 5 days ago. There was no injury mechanism. The pain is present in the right knee. The quality of the pain is described as aching. The pain is at a severity of 9/10. The pain is moderate. The pain has been constant since onset. Associated symptoms include numbness and tingling. Pertinent negatives include no inability to bear weight. She reports no foreign bodies present. The symptoms are aggravated by weight bearing. She has tried nothing for the symptoms. The treatment provided no relief.      Review of Systems  Constitutional: Negative.   HENT: Negative.   Eyes: Negative.   Respiratory: Negative.  Negative for shortness of breath.   Cardiovascular: Negative.  Negative for palpitations.  Gastrointestinal: Negative.   Endocrine: Negative.   Genitourinary: Negative.   Musculoskeletal: Negative.   Neurological: Positive for tingling and numbness. Negative for headaches.  Hematological: Negative.   Psychiatric/Behavioral: Negative.   All other systems reviewed and are negative.      Objective:   Physical Exam  Constitutional: She is oriented to person, place, and time. She appears well-developed and well-nourished. No distress.  HENT:  Head: Normocephalic.  Cardiovascular: Normal rate, regular rhythm, normal heart sounds and intact distal pulses.   No murmur heard. Pulmonary/Chest: Effort normal and breath sounds normal. No respiratory distress. She has no wheezes.  Abdominal: Soft. Bowel sounds are normal. She exhibits no distension. There is no tenderness.  Musculoskeletal: Normal range of motion. She exhibits no edema or tenderness.  Full ROM of right knee, pain medial and laterally of knee with flexion   Neurological: She is alert and oriented to person, place, and time.  Skin: Skin is warm and dry.  Psychiatric: She has a normal mood and  affect. Her behavior is normal. Judgment and thought content normal.  Vitals reviewed.   BP 102/67 mmHg  Pulse 58  Temp(Src) 98.2 F (36.8 C) (Oral)  Ht 5\' 2"  (1.575 m)  Wt 184 lb 9.6 oz (83.734 kg)  BMI 33.76 kg/m2  LMP 02/05/2016       Assessment & Plan:  1. Knee strain, right, initial encounter -Rest -Ice and heat as needed -ROM exercises discussed- Handout given RTO in 2 weeks - predniSONE (STERAPRED UNI-PAK 21 TAB) 10 MG (21) TBPK tablet; Take 1 tablet (10 mg total) by mouth daily. As directed x 6 days  Dispense: 21 tablet; Refill: 0 - naproxen (NAPROSYN) 500 MG tablet; Take 1 tablet (500 mg total) by mouth 2 (two) times daily with a meal.  Dispense: 60 tablet; Refill: 1  2. Obesity (BMI 30-39.9)  Evelina Dun, FNP

## 2016-04-15 NOTE — Patient Instructions (Signed)
Lateral Collateral Knee Ligament Sprain With Phase I Rehab The lateral collateral ligament (LCL) of the knee helps hold the knee joint in proper alignment and prevents the bones from shifting out of alignment (displacing) toward the outside (laterally). Injury to the knee may cause a tear in the LCL ligament (sprain). The LCL is the least common ligament of the knee to be injured. Sprains may heal on their own, but they often result in a loose joint. Sprains are classified into three categories. Grade 1 sprains cause pain, but the tendon is not lengthened. Grade 2 sprains include a lengthened ligament, due to the ligament being stretched or partially ruptured. With grade 2 sprains there is still function, although the function may be decreased. Grade 3 sprains involve a complete tear of the tendon or muscle, and function is usually impaired. SYMPTOMS   Pain and tenderness on the outer side of the knee.  A "pop," tearing, or pulling sensation at the time of injury.  Bruising (contusion) at the site of injury within 48 hours of injury.  Knee stiffness.  Limping, often walking with the knee bent. CAUSES  An LCL sprain occurs when a force is placed on the ligament that is greater than it can handle. Common causes of injury include:  Direct hit (trauma) to the inner side of the knee, especially if the foot is planted on the ground.  Forceful pivoting of the body and leg while the foot is planted on the ground. RISK INCREASES WITH:  Contact sports (football, rugby).  Sports that require pivoting or cutting (soccer).  Poor knee strength and flexibility.  Improper equipment use. PREVENTION   Warm up and stretch properly before activity.  Maintain physical fitness:  Strength, flexibility, and endurance.  Cardiovascular fitness.  Wear properly fitted protective equipment (correct length of cleats for surface).  Functional braces may be effective in preventing injury. PROGNOSIS  If  treated properly, LCL tears usually heal on their own. Sometimes, surgery is required. RELATED COMPLICATIONS   Frequently recurring symptoms, such as knee giving way, instability, and swelling.  Injury to other structures in the knee joint.  Meniscal cartilage, resulting in locking and swelling of the knee.  Articular cartilage, resulting in knee arthritis.  Other ligaments of the knee (commonly).  Injury to nerves, causing numbness of the outer leg, foot, and ankle and weakness or paralysis, with inability to raise the ankle, big toe, or lesser toes.  Knee stiffness (loss of knee motion). TREATMENT  Treatment first involves the use of ice and medicine to reduce pain and inflammation. The use of strengthening and stretching exercises may help reduce pain with activity. These exercises may be performed at home, but referral to a therapist is often advised. You may be advised to walk with crutches until you are able to walk without a limp. Your caregiver may provide you with a hinged knee brace to help regain a full range of motion while also protecting the injured knee. For severe LCL injuries, or injuries that involve other ligaments of the knee, surgery is often advised. MEDICATION   If pain medicine is needed, nonsteroidal anti-inflammatory medicines (aspirin and ibuprofen), or other minor pain relievers (acetaminophen), are often advised.  Do not take pain medicine for 7 days before surgery.  Prescription pain relievers may be given, if your caregiver thinks they are needed. Use only as directed and only as much as you need. HEAT AND COLD  Cold treatment (icing) should be applied for 10 to 15 minutes  every 2 to 3 hours for inflammation and pain, and immediately after activity that aggravates your symptoms. Use ice packs or an ice massage.  Heat treatment may be used before performing stretching and strengthening activities prescribed by your caregiver, physical therapist, or athletic  trainer. Use a heat pack or a warm water soak. SEEK MEDICAL CARE IF:   Symptoms get worse or do not improve in 4 to 6 weeks, despite treatment.  New, unexplained symptoms develop. (Drugs used in treatment may produce side effects.) EXERCISES RANGE OF MOTION (ROM) AND STRETCHING EXERCISES - Lateral Collateral Knee Ligament Sprain Phase I These are some of the initial exercises that your physician, physical therapist or athletic trainer may have you perform to begin your rehabilitation. When you demonstrate gains in your flexibility and strength, your caregiver may progress you to Phase II exercises. As you perform these exercises, remember:   These initial exercises are intended to be gentle. They will help you restore motion without increasing any swelling.  Completing these exercises allows less painful movement and prepares you for the more aggressive strengthening exercises in Phase II.  An effective stretch should be held for at least 30 seconds.  A stretch should never be painful. You should only feel a gentle lengthening or release in the stretched tissue. RANGE OF MOTION - Knee Flexion, Active  Lie on your back with both knees straight. (If this causes back discomfort, bend your opposite knee, placing your foot flat on the floor.)  Slowly slide your heel back toward your buttocks until you feel a gentle stretch in the front of your knee or thigh.  Hold for __________ seconds. Slowly slide your heel back to the starting position. Repeat __________ times. Complete this exercise __________ times per day.  STRETCH - Knee Flexion, Supine  Lie on the floor with your right / left heel and foot lightly touching the wall. (Place both feet on the wall, if you do not use a door frame.)  Without using any effort, allow gravity to slide your foot down the wall slowly until you feel a gentle stretch in the front of your right / left knee.  Hold this stretch for __________ seconds. Then return  the leg to the starting position, using your healthy leg for help, if needed. Repeat __________ times. Complete this stretch __________ times per day.  RANGE OF MOTION - Knee Flexion and Extension, Active-Assisted  Sit on the edge of a table or chair with your thighs firmly supported. It may be helpful to place a folded towel under the end of your right / left thigh.  Flexion (bending): Place the ankle of your healthy leg on top of the other ankle. Use your healthy leg to gently bend your right / left knee until you feel a mild tension across the top of your knee.  Hold for __________ seconds.  Extension (straightening): Switch your ankles so your right / left leg is on top. Use your healthy leg to straighten your right / left knee until you feel a mild tension on the backside of your knee.  Hold for __________ seconds. Repeat __________ times. Complete this exercise __________ times per day. STRETCH - Knee Extension Sitting  Sit with your right / left leg/heel propped on another chair, coffee table, or foot stool.  Allow your leg muscles to relax, letting gravity straighten out your knee.*  You should feel a stretch behind your right / left knee. Hold this position for __________ seconds. Repeat __________ times.  Complete this stretch __________ times per day.  *Your physician, physical therapist, or athletic trainer may instruct you place a __________ weight on your thigh, just above your kneecap, to deepen the stretch.  STRENGTHENING EXERCISES Lateral Collateral Knee Ligament Sprain - Phase I These exercises may help you when beginning to rehabilitate your injury. They may resolve your symptoms with or without further involvement from your physician, physical therapist, or athletic trainer. While completing these exercises, remember:   Muscles can gain both the endurance and the strength needed for everyday activities through controlled exercises.  Complete these exercises as  instructed by your physician, physical therapist or athletic trainer. Increase the resistance and repetitions only as guided.  In order to return to more demanding activities, you will likely need to progress to more challenging exercises. Your physician, physical therapist or athletic trainer will advance your exercises when your tissues show adequate healing and your muscles demonstrate increased strength. STRENGTH - Quadriceps, Isometrics  Lie on your back with your right / left leg extended and your opposite knee bent.  Gradually tense the muscles in the front of your right / left thigh. You should see either your kneecap slide up toward your hip or increased dimpling just above the knee. This motion will push the back of the knee down toward the floor, mat, or bed on which you are lying.  Hold the muscle as tight as you can without increasing your pain for __________ seconds.  Relax the muscles slowly and completely between each repetition. Repeat __________ times. Complete this exercise __________ times per day.  STRENGTH - Quadriceps, Short Arcs   Lie on your back. Place a __________ inch towel roll under your right / left knee, so that the knee bends slightly.  Raise only your lower leg by tightening the muscles in the front of your thigh. Do not allow your thigh to rise.  Hold this position for __________ seconds. Repeat __________ times. Complete this exercise __________ times per day.  OPTIONAL ANKLE WEIGHTS: Begin with ____________________, but DO NOT exceed ____________________. Increase in 1 pound/0.5 kilogram increments. STRENGTH - Quadriceps, Straight Leg Raises  Quality counts! Watch for signs that the quadriceps muscle is working, to be sure you are strengthening the correct muscles and not "cheating" by substituting with healthier muscles.  Lie on your back with your right / left leg extended and your opposite knee bent.  Tense the muscles in the front of your right /  left thigh. You should see either your kneecap slide up or increased dimpling just above the knee. Your thigh may even shake a bit.  Tighten these muscles even more and raise your leg 4 to 6 inches off the floor. Hold for __________ seconds.  Keeping these muscles tense, lower your leg.  Relax the muscles slowly and completely in between each repetition. Repeat __________ times. Complete this exercise __________ times per day.  STRENGTH - Hamstring, Isometrics   Lie on your back, on a firm surface.  Bend your right / left knee approximately __________ degrees.  Dig your heel into the surface as if you are trying to pull it toward your buttocks. Tighten the muscles in the back of your thighs to "dig" as hard as you can, without increasing any pain.  Hold this position for __________ seconds.  Release the tension gradually and allow your muscle to completely relax for __________ seconds in between each exercise. Repeat __________ times. Complete this exercise __________ times per day.  STRENGTH - Hamstring,  Curls   Lie on your stomach with your legs extended. (If you lie on a bed, your feet may hang over the edge.)  Tighten the muscles in the back of your thigh to bend your right / left knee up to 90 degrees. Keep your hips flat on the bed.  Hold this position for __________ seconds.  Slowly lower your leg back to the starting position. Repeat __________ times. Complete this exercise __________ times per day.  OPTIONAL ANKLE WEIGHTS: Begin with ____________________, but DO NOT exceed ____________________. Increase in 1 pound/0.5 kilogram increments.   This information is not intended to replace advice given to you by your health care provider. Make sure you discuss any questions you have with your health care provider.   Document Released: 09/19/2005 Document Revised: 10/10/2014 Document Reviewed: 01/01/2009 Elsevier Interactive Patient Education [employer].

## 2016-04-18 ENCOUNTER — Other Ambulatory Visit: Payer: Self-pay | Admitting: Advanced Practice Midwife

## 2016-04-18 DIAGNOSIS — R1032 Left lower quadrant pain: Secondary | ICD-10-CM

## 2016-04-19 ENCOUNTER — Telehealth: Payer: Self-pay | Admitting: Advanced Practice Midwife

## 2016-04-19 NOTE — Telephone Encounter (Signed)
Pt informed of + GC results from 04/15/2016, Nigel Berthold, CNM not in office today but pt does have an appt with her tomorrow will discuss treatment and POC at that time. Pt verbalized understanding.

## 2016-04-20 ENCOUNTER — Other Ambulatory Visit: Payer: Self-pay | Admitting: Advanced Practice Midwife

## 2016-04-20 ENCOUNTER — Encounter: Payer: Self-pay | Admitting: Advanced Practice Midwife

## 2016-04-20 ENCOUNTER — Ambulatory Visit (INDEPENDENT_AMBULATORY_CARE_PROVIDER_SITE_OTHER): Payer: Medicaid Other

## 2016-04-20 ENCOUNTER — Ambulatory Visit (INDEPENDENT_AMBULATORY_CARE_PROVIDER_SITE_OTHER): Payer: Medicaid Other | Admitting: Advanced Practice Midwife

## 2016-04-20 VITALS — BP 150/72 | HR 68 | Ht 62.0 in | Wt 185.0 lb

## 2016-04-20 DIAGNOSIS — A64 Unspecified sexually transmitted disease: Secondary | ICD-10-CM | POA: Insufficient documentation

## 2016-04-20 DIAGNOSIS — N854 Malposition of uterus: Secondary | ICD-10-CM | POA: Diagnosis not present

## 2016-04-20 DIAGNOSIS — A5909 Other urogenital trichomoniasis: Secondary | ICD-10-CM

## 2016-04-20 DIAGNOSIS — R1032 Left lower quadrant pain: Secondary | ICD-10-CM

## 2016-04-20 DIAGNOSIS — A549 Gonococcal infection, unspecified: Secondary | ICD-10-CM | POA: Diagnosis not present

## 2016-04-20 DIAGNOSIS — R300 Dysuria: Secondary | ICD-10-CM

## 2016-04-20 LAB — CHLAMYDIA/GONOCOCCUS/TRICHOMONAS, NAA
Chlamydia by NAA: NEGATIVE
Gonococcus by NAA: POSITIVE — AB
Trich vag by NAA: POSITIVE — AB

## 2016-04-20 MED ORDER — AZITHROMYCIN 500 MG PO TABS: 1000.0000 mg | ORAL_TABLET | Freq: Once | ORAL | Status: DC

## 2016-04-20 MED ORDER — METRONIDAZOLE 500 MG PO TABS: 500.0000 mg | ORAL_TABLET | Freq: Two times a day (BID) | ORAL | Status: DC

## 2016-04-20 MED ORDER — PHENAZOPYRIDINE HCL 95 MG PO TABS: 95.0000 mg | ORAL_TABLET | Freq: Three times a day (TID) | ORAL | Status: DC | PRN

## 2016-04-20 MED ORDER — CEFTRIAXONE SODIUM 1 G IJ SOLR
250.0000 mg | Freq: Once | INTRAMUSCULAR | Status: AC
  Administered 2016-04-20: 250 mg via INTRAMUSCULAR

## 2016-04-20 NOTE — Progress Notes (Addendum)
[facility] Visit  Patient name: Rebecca Delacruz MRN [id number]  Date of birth: 11-16-1985  CC & HPI:  Rebecca Delacruz is a 30 y.o. African American female presenting today for pelvic US.  She was seen last week for STD screening (states had unprotected sex X 1 with a "sketchy" guy).  LLQ pain that she thought felt like an ovarian cyst.  Also c/o dysuria at beginning and end of void.   Pertinent History Reviewed:  Medical & Surgical Hx:   Past Medical History  Diagnosis Date  . Ovarian cyst   . Gonorrhea   . Trichomonas   . Constipation   . No pertinent past medical history   . Shortness of breath   . Back pain   . Asthma   . Scoliosis    Past Surgical History  Procedure Laterality Date  . No past surgeries    . Tubal ligation  10/15/2011    Procedure: POST PARTUM TUBAL LIGATION;  Surgeon: Jonnie Kind, MD;  Location: [facility];  Service: Gynecology;  Laterality: Bilateral;  Post partum tubal ligation bilateral with filshie clips   Family History  Problem Relation Age of Onset  . Diabetes Mother   . Anesthesia problems Neg Hx   . Hypotension Neg Hx   . Malignant hyperthermia Neg Hx   . Pseudochol deficiency Neg Hx     Current outpatient prescriptions:  .  albuterol (PROVENTIL HFA;VENTOLIN HFA) 108 (90 BASE) MCG/ACT inhaler, Inhale 2 puffs into the lungs every 6 (six) hours as needed for wheezing., Disp: 18 g, Rfl: 6 .  albuterol (PROVENTIL) (2.5 MG/3ML) 0.083% nebulizer solution, Take 3 mLs (2.5 mg total) by nebulization every 6 (six) hours as needed for wheezing or shortness of breath., Disp: 150 mL, Rfl: 1 .  azithromycin (ZITHROMAX) 500 MG tablet, Take 2 tablets (1,000 mg total) by mouth once., Disp: 2 tablet, Rfl: 0 .  budesonide-formoterol (SYMBICORT) 160-4.5 MCG/ACT inhaler, Inhale 2 puffs into the lungs 2 (two) times daily., Disp: 1 Inhaler, Rfl: 3 .  HYDROcodone-acetaminophen (NORCO/VICODIN) 5-325 MG tablet, Take 1-2 tablets by mouth every 4 (four) hours  as needed., Disp: 6 tablet, Rfl: 0 .  metroNIDAZOLE (FLAGYL) 500 MG tablet, Take 1 tablet (500 mg total) by mouth 2 (two) times daily., Disp: 14 tablet, Rfl: 0 .  naproxen (NAPROSYN) 500 MG tablet, Take 1 tablet (500 mg total) by mouth 2 (two) times daily with a meal., Disp: 60 tablet, Rfl: 1 .  predniSONE (STERAPRED UNI-PAK 21 TAB) 10 MG (21) TBPK tablet, Take 1 tablet (10 mg total) by mouth daily. As directed x 6 days, Disp: 21 tablet, Rfl: 0 .  phenazopyridine (PYRIDIUM) 95 MG tablet, Take 1 tablet (95 mg total) by mouth 3 (three) times daily as needed for pain., Disp: 10 tablet, Rfl: 0  Current facility-administered medications:  .  fluticasone (FLONASE) 50 MCG/ACT nasal spray 2 spray, 2 spray, Each Nare, Daily, Lysbeth Penner, FNP Social History: Reviewed -  reports that she has been smoking Cigarettes.  She has a 5 pack-year smoking history. She has never used smokeless tobacco.  Review of Systems:   Constitutional: Negative for fever and chills Eyes: Negative for visual disturbances Respiratory: Negative for shortness of breath, dyspnea Cardiovascular: Negative for chest pain or palpitations  Gastrointestinal: Negative for vomiting, diarrhea and constipation; Genitourinary:as in HPI Musculoskeletal: Negative for back pain, joint pain, myalgias  Neurological: Negative for dizziness and headaches    Objective Findings:  Physical Examination: General appearance - well appearing, and in no distress Mental status - alert, oriented to person, place, and time Chest:  Normal respiratory effort Heart - normal rate and regular rhythm Abdomen:  Soft, nontender Musculoskeletal:  Normal range of motion without pain Extremities:  No edema PELVIC US TA/TV: Normal homogenous anteverted uterus,EEC 4.7 mm,normal ov's bilat (mobile),no free fluid,bilat adnexal discomfort during ultrasound           No results found for this or any previous visit (from the past 24 hour(s)).     Assessment & Plan:  A:   GC an trich; normal pelvic US P:  Rocephin 250mg  IM given an rx Azithromycin1gm, Flagyl 500mg  BID.  Pt given card for partner to give to his MD for treatment   Rx pyridium  Return if symptoms worsen or fail to improve.   Order for POC (to give [facility] a urine sample) after 8/14  CRESENZO-DISHMAN,Daritza Brees CNM 04/20/2016 3:02 PM

## 2016-04-20 NOTE — Progress Notes (Signed)
PELVIC US TA/TV: Normal homogenous anteverted uterus,EEC 4.7 mm,normal ov's bilat (mobile),no free fluid,bilat adnexal discomfort during ultrasound

## 2016-06-04 ENCOUNTER — Ambulatory Visit (INDEPENDENT_AMBULATORY_CARE_PROVIDER_SITE_OTHER): Payer: Medicaid Other | Admitting: Family Medicine

## 2016-06-04 VITALS — BP 102/59 | HR 70 | Temp 97.5°F | Ht 62.0 in | Wt 180.8 lb

## 2016-06-04 DIAGNOSIS — R200 Anesthesia of skin: Secondary | ICD-10-CM

## 2016-06-04 DIAGNOSIS — R208 Other disturbances of skin sensation: Secondary | ICD-10-CM | POA: Diagnosis not present

## 2016-06-04 DIAGNOSIS — H7092 Unspecified mastoiditis, left ear: Secondary | ICD-10-CM

## 2016-06-04 NOTE — Progress Notes (Signed)
   HPI  Patient presents today here with left-sided facial pain and numbness.  She states that she woke up this morning with left-sided facial pain described as aching pain in the left face, left mastoid area, and radiating down the left neck. She feels like it's swelling specifically behind her left ear. She denies any fevers or sweats. She began having chills last night.  She is eating and drinking normally and breathing normally. She denies cough.  She also states that she has altered sensation and facial numbness on her left lower face and her left arm.  She's walking normally and denies any weakness or any other numbness besides was described.  PMH: Smoking status noted ROS: Per HPI  Objective: BP (!) 102/59 (BP Location: Left Arm, Patient Position: Sitting, Cuff Size: Normal)   Pulse 70   Temp 97.5 F (36.4 C) (Oral)   Ht 5\' 2"  (1.575 m)   Wt 180 lb 12.8 oz (82 kg)   BMI 33.07 kg/m  Gen: NAD, alert, cooperative with exam HEENT: NCAT, left-sided maxillary tenderness to palpation as well as tenderness to palpation over the left mastoid. TMs normal bilaterally, Her face is asymmetric, her left eye appears to be lower set than the right eye  I cannot appreciate any swelling of the left mastoid CV: RRR, good S1/S2, no murmur Resp: CTABL, no wheezes, non-labored Ext: No edema, warm Neuro: Alert and oriented, strength 5/5 in bilateral upper and lower extremities, 2+ symmetric patellar tendon reflexes Cranial nerves II through XII intact except for altered sensation in V2 and V3 of 5th cranial nerve. Symmetric smile She also reports altered sensation on the left arm with light touch.  Assessment and plan:  # Mastoiditis She presents today with rapid onset left-sided facial pain with tenderness to palpation of the left mastoid. She also has left-sided facial numbness and left arm numbness and paresthesias.   Given my concern for mastoiditis I think that CT to rule out  intracranial process. Her arm paresthesias would not be consistent with a left-sided lesion, however given the constellation of symptoms I think it's most prudent to go ahead and send her to the emergency room.  She does not appear septic and she has a ride to the hospital so I sent her by car.  The emergency room charge nurse has been contacted and is expecting her arrival.   Neurologic exam is very reassuring and reduces my concern for central CNS lesion, however her paresthesias of the left face and left arm are unexplained. I do not see any concern for bell's palsy at this time, her facial assymetry appears to be long standing and is not consistent with neurologic deficit.   Appreciate ED's eval and treatment.    Laroy Apple, MD [facility] 06/04/2016, 11:50 AM

## 2016-06-04 NOTE — Patient Instructions (Signed)
Great to meet you!  I am sending you to the emergency room or 2 reasons, first because I'm concerned about her numbness, and second because I'm concerned about the pain behind her ear. This could be a condition called mastoiditis.  Please go the emergency room for evaluation

## 2016-06-13 ENCOUNTER — Ambulatory Visit (INDEPENDENT_AMBULATORY_CARE_PROVIDER_SITE_OTHER): Payer: Medicaid Other | Admitting: Family Medicine

## 2016-06-13 ENCOUNTER — Encounter: Payer: Self-pay | Admitting: Family Medicine

## 2016-06-13 VITALS — BP 104/72 | HR 85 | Temp 98.3°F | Ht 62.0 in | Wt 184.4 lb

## 2016-06-13 DIAGNOSIS — M67432 Ganglion, left wrist: Secondary | ICD-10-CM | POA: Diagnosis not present

## 2016-06-13 DIAGNOSIS — S60052A Contusion of left little finger without damage to nail, initial encounter: Secondary | ICD-10-CM

## 2016-06-13 NOTE — Progress Notes (Signed)
BP 104/72   Pulse 85   Temp 98.3 F (36.8 C) (Oral)   Ht 5\' 2"  (1.575 m)   Wt 184 lb 6 oz (83.6 kg)   LMP 05/28/2016 (Approximate)   BMI 33.72 kg/m    Subjective:    Patient ID: Rebecca Delacruz, female    DOB: 09/07/1986, 30 y.o.   MRN: [id number]  HPI: Rebecca Delacruz is a 30 y.o. female presenting on 06/13/2016 for Knot on left wrist (x 3 days) and Left 5th finger pain (smashed finger in door earlier today)   HPI Finger pain Patient smashed the tip of her left fifth finger in a door earlier today and has been having some pain since then and wanted to get it checked out see if it is fractured. She denies any loss of range of motion but does have some swelling and also denies any numbness or weakness in that finger. She does not have any open cuts or wounds from this incident either. She has not taken anything for it at home yet.  Lump on wrist Patient has a small lump on the dorsum of her left wrist that is also slightly tender and she wanted to get it checked out. She denies any fevers or chills or overlying redness or warmth. Just popped up in the last few days. She denies any loss of range of motion in the wrist or changes in circulation or sensation because of this lump.  Relevant past medical, surgical, family and social history reviewed and updated as indicated. Interim medical history since our last visit reviewed. Allergies and medications reviewed and updated.  Review of Systems  Constitutional: Negative for chills and fever.  Musculoskeletal: Positive for arthralgias and joint swelling. Negative for back pain and gait problem.  Skin: Negative for color change and rash.  All other systems reviewed and are negative.   Per HPI unless specifically indicated above      Objective:    BP 104/72   Pulse 85   Temp 98.3 F (36.8 C) (Oral)   Ht 5\' 2"  (1.575 m)   Wt 184 lb 6 oz (83.6 kg)   LMP 05/28/2016 (Approximate)   BMI 33.72 kg/m   Wt Readings from Last 3  Encounters:  06/13/16 184 lb 6 oz (83.6 kg)  06/04/16 180 lb 12.8 oz (82 kg)  04/20/16 185 lb (83.9 kg)    Physical Exam  Constitutional: She appears well-developed and well-nourished. No distress.  Eyes: Conjunctivae are normal.  Musculoskeletal:       Left wrist: She exhibits tenderness (Slightly tender ganglion cyst on the dorsum of her wrist, very small, no erythema or warmth overlying.). She exhibits normal range of motion, no crepitus, no deformity and no laceration.       Left hand: She exhibits normal range of motion.       Hands: Skin: Skin is warm and dry. No rash noted. She is not diaphoretic. No erythema.      Assessment & Plan:   Problem List Items Addressed This Visit    None    Visit Diagnoses    Ganglion cyst of wrist, left    -  Primary   Ice and monitor, very small, will wait to see if grows in size   Contusion of left little finger without damage to nail, initial encounter       Recommended ice and ibuprofen, take with food. Mobility intact and sensation intact with minor swelling  Follow up plan: Return if symptoms worsen or fail to improve.  Counseling provided for all of the vaccine components No orders of the defined types were placed in this encounter.   Caryl Pina, MD [facility] 06/13/2016, 6:26 PM

## 2016-06-23 ENCOUNTER — Telehealth: Payer: Self-pay | Admitting: Family

## 2016-06-23 DIAGNOSIS — L84 Corns and callosities: Secondary | ICD-10-CM

## 2016-06-23 NOTE — Telephone Encounter (Signed)
Referral placed for Podiatry. Pt needs to be seen for back pain

## 2016-06-23 NOTE — Telephone Encounter (Signed)
Patient called stating she needed a referral to podiatry Dr. Irena Reichmann for calluses on feet and referral to orthopedic for Back pain.  Patient does not want to go to [place].

## 2016-06-23 NOTE — Telephone Encounter (Signed)
Patient aware.

## 2016-07-01 ENCOUNTER — Ambulatory Visit (INDEPENDENT_AMBULATORY_CARE_PROVIDER_SITE_OTHER): Payer: Medicaid Other | Admitting: Family

## 2016-07-01 ENCOUNTER — Encounter: Payer: Self-pay | Admitting: Family

## 2016-07-01 VITALS — BP 102/57 | HR 76 | Temp 96.0°F | Ht 62.0 in | Wt 182.2 lb

## 2016-07-01 DIAGNOSIS — M5431 Sciatica, right side: Secondary | ICD-10-CM

## 2016-07-01 MED ORDER — KETOROLAC TROMETHAMINE 60 MG/2ML IM SOLN
60.0000 mg | Freq: Once | INTRAMUSCULAR | Status: AC
  Administered 2016-07-01: 60 mg via INTRAMUSCULAR

## 2016-07-01 MED ORDER — NAPROXEN 500 MG PO TABS: 500.0000 mg | ORAL_TABLET | Freq: Two times a day (BID) | ORAL | 1 refills | Status: DC

## 2016-07-01 MED ORDER — CYCLOBENZAPRINE HCL 10 MG PO TABS: 10.0000 mg | ORAL_TABLET | Freq: Three times a day (TID) | ORAL | 0 refills | Status: DC | PRN

## 2016-07-01 MED ORDER — PREDNISONE 10 MG (21) PO TBPK: ORAL_TABLET | ORAL | 0 refills | Status: DC

## 2016-07-01 NOTE — Patient Instructions (Signed)
Sciatica With Rehab The sciatic nerve runs from the back down the leg and is responsible for sensation and control of the muscles in the back (posterior) side of the thigh, lower leg, and foot. Sciatica is a condition that is characterized by inflammation of this nerve.  SYMPTOMS   Signs of nerve damage, including numbness and/or weakness along the posterior side of the lower extremity.  Pain in the back of the thigh that may also travel down the leg.  Pain that worsens when sitting for long periods of time.  Occasionally, pain in the back or buttock. CAUSES  Inflammation of the sciatic nerve is the cause of sciatica. The inflammation is due to something irritating the nerve. Common sources of irritation include:  Sitting for long periods of time.  Direct trauma to the nerve.  Arthritis of the spine.  Herniated or ruptured disk.  Slipping of the vertebrae (spondylolisthesis).  Pressure from soft tissues, such as muscles or ligament-like tissue (fascia). RISK INCREASES WITH:  Sports that place pressure or stress on the spine (football or weightlifting).  Poor strength and flexibility.  Failure to warm up properly before activity.  Family history of low back pain or disk disorders.  Previous back injury or surgery.  Poor body mechanics, especially when lifting, or poor posture. PREVENTION   Warm up and stretch properly before activity.  Maintain physical fitness:  Strength, flexibility, and endurance.  Cardiovascular fitness.  Learn and use proper technique, especially with posture and lifting. When possible, have coach correct improper technique.  Avoid activities that place stress on the spine. PROGNOSIS If treated properly, then sciatica usually resolves within 6 weeks. However, occasionally surgery is necessary.  RELATED COMPLICATIONS   Permanent nerve damage, including pain, numbness, tingle, or weakness.  Chronic back pain.  Risks of surgery: infection,  bleeding, nerve damage, or damage to surrounding tissues. TREATMENT Treatment initially involves resting from any activities that aggravate your symptoms. The use of ice and medication may help reduce pain and inflammation. The use of strengthening and stretching exercises may help reduce pain with activity. These exercises may be performed at home or with referral to a therapist. A therapist may recommend further treatments, such as transcutaneous electronic nerve stimulation (TENS) or ultrasound. Your caregiver may recommend corticosteroid injections to help reduce inflammation of the sciatic nerve. If symptoms persist despite non-surgical (conservative) treatment, then surgery may be recommended. MEDICATION  If pain medication is necessary, then nonsteroidal anti-inflammatory medications, such as aspirin and ibuprofen, or other minor pain relievers, such as acetaminophen, are often recommended.  Do not take pain medication for 7 days before surgery.  Prescription pain relievers may be given if deemed necessary by your caregiver. Use only as directed and only as much as you need.  Ointments applied to the skin may be helpful.  Corticosteroid injections may be given by your caregiver. These injections should be reserved for the most serious cases, because they may only be given a certain number of times. HEAT AND COLD  Cold treatment (icing) relieves pain and reduces inflammation. Cold treatment should be applied for 10 to 15 minutes every 2 to 3 hours for inflammation and pain and immediately after any activity that aggravates your symptoms. Use ice packs or massage the area with a piece of ice (ice massage).  Heat treatment may be used prior to performing the stretching and strengthening activities prescribed by your caregiver, physical therapist, or athletic trainer. Use a heat pack or soak the injury in warm water.   SEEK MEDICAL CARE IF:  Treatment seems to offer no benefit, or the condition  worsens.  Any medications produce adverse side effects. EXERCISES  RANGE OF MOTION (ROM) AND STRETCHING EXERCISES - Sciatica Most people with sciatic will find that their symptoms worsen with either excessive bending forward (flexion) or arching at the low back (extension). The exercises which will help resolve your symptoms will focus on the opposite motion. Your physician, physical therapist or athletic trainer will help you determine which exercises will be most helpful to resolve your low back pain. Do not complete any exercises without first consulting with your clinician. Discontinue any exercises which worsen your symptoms until you speak to your clinician. If you have pain, numbness or tingling which travels down into your buttocks, leg or foot, the goal of the therapy is for these symptoms to move closer to your back and eventually resolve. Occasionally, these leg symptoms will get better, but your low back pain may worsen; this is typically an indication of progress in your rehabilitation. Be certain to be very alert to any changes in your symptoms and the activities in which you participated in the 24 hours prior to the change. Sharing this information with your clinician will allow him/her to most efficiently treat your condition. These exercises may help you when beginning to rehabilitate your injury. Your symptoms may resolve with or without further involvement from your physician, physical therapist or athletic trainer. While completing these exercises, remember:   Restoring tissue flexibility helps normal motion to return to the joints. This allows healthier, less painful movement and activity.  An effective stretch should be held for at least 30 seconds.  A stretch should never be painful. You should only feel a gentle lengthening or release in the stretched tissue. FLEXION RANGE OF MOTION AND STRETCHING EXERCISES: STRETCH - Flexion, Single Knee to Chest   Lie on a firm bed or floor  with both legs extended in front of you.  Keeping one leg in contact with the floor, bring your opposite knee to your chest. Hold your leg in place by either grabbing behind your thigh or at your knee.  Pull until you feel a gentle stretch in your low back. Hold __________ seconds.  Slowly release your grasp and repeat the exercise with the opposite side. Repeat __________ times. Complete this exercise __________ times per day.  STRETCH - Flexion, Double Knee to Chest  Lie on a firm bed or floor with both legs extended in front of you.  Keeping one leg in contact with the floor, bring your opposite knee to your chest.  Tense your stomach muscles to support your back and then lift your other knee to your chest. Hold your legs in place by either grabbing behind your thighs or at your knees.  Pull both knees toward your chest until you feel a gentle stretch in your low back. Hold __________ seconds.  Tense your stomach muscles and slowly return one leg at a time to the floor. Repeat __________ times. Complete this exercise __________ times per day.  STRETCH - Low Trunk Rotation   Lie on a firm bed or floor. Keeping your legs in front of you, bend your knees so they are both pointed toward the ceiling and your feet are flat on the floor.  Extend your arms out to the side. This will stabilize your upper body by keeping your shoulders in contact with the floor.  Gently and slowly drop both knees together to one side until   you feel a gentle stretch in your low back. Hold for __________ seconds.  Tense your stomach muscles to support your low back as you bring your knees back to the starting position. Repeat the exercise to the other side. Repeat __________ times. Complete this exercise __________ times per day  EXTENSION RANGE OF MOTION AND FLEXIBILITY EXERCISES: STRETCH - Extension, Prone on Elbows  Lie on your stomach on the floor, a bed will be too soft. Place your palms about shoulder  width apart and at the height of your head.  Place your elbows under your shoulders. If this is too painful, stack pillows under your chest.  Allow your body to relax so that your hips drop lower and make contact more completely with the floor.  Hold this position for __________ seconds.  Slowly return to lying flat on the floor. Repeat __________ times. Complete this exercise __________ times per day.  RANGE OF MOTION - Extension, Prone Press Ups  Lie on your stomach on the floor, a bed will be too soft. Place your palms about shoulder width apart and at the height of your head.  Keeping your back as relaxed as possible, slowly straighten your elbows while keeping your hips on the floor. You may adjust the placement of your hands to maximize your comfort. As you gain motion, your hands will come more underneath your shoulders.  Hold this position __________ seconds.  Slowly return to lying flat on the floor. Repeat __________ times. Complete this exercise __________ times per day.  STRENGTHENING EXERCISES - Sciatica  These exercises may help you when beginning to rehabilitate your injury. These exercises should be done near your "sweet spot." This is the neutral, low-back arch, somewhere between fully rounded and fully arched, that is your least painful position. When performed in this safe range of motion, these exercises can be used for people who have either a flexion or extension based injury. These exercises may resolve your symptoms with or without further involvement from your physician, physical therapist or athletic trainer. While completing these exercises, remember:   Muscles can gain both the endurance and the strength needed for everyday activities through controlled exercises.  Complete these exercises as instructed by your physician, physical therapist or athletic trainer. Progress with the resistance and repetition exercises only as your caregiver advises.  You may  experience muscle soreness or fatigue, but the pain or discomfort you are trying to eliminate should never worsen during these exercises. If this pain does worsen, stop and make certain you are following the directions exactly. If the pain is still present after adjustments, discontinue the exercise until you can discuss the trouble with your clinician. STRENGTHENING - Deep Abdominals, Pelvic Tilt   Lie on a firm bed or floor. Keeping your legs in front of you, bend your knees so they are both pointed toward the ceiling and your feet are flat on the floor.  Tense your lower abdominal muscles to press your low back into the floor. This motion will rotate your pelvis so that your tail bone is scooping upwards rather than pointing at your feet or into the floor.  With a gentle tension and even breathing, hold this position for __________ seconds. Repeat __________ times. Complete this exercise __________ times per day.  STRENGTHENING - Abdominals, Crunches   Lie on a firm bed or floor. Keeping your legs in front of you, bend your knees so they are both pointed toward the ceiling and your feet are flat on the   floor. Cross your arms over your chest.  Slightly tip your chin down without bending your neck.  Tense your abdominals and slowly lift your trunk high enough to just clear your shoulder blades. Lifting higher can put excessive stress on the low back and does not further strengthen your abdominal muscles.  Control your return to the starting position. Repeat __________ times. Complete this exercise __________ times per day.  STRENGTHENING - Quadruped, Opposite UE/LE Lift  Assume a hands and knees position on a firm surface. Keep your hands under your shoulders and your knees under your hips. You may place padding under your knees for comfort.  Find your neutral spine and gently tense your abdominal muscles so that you can maintain this position. Your shoulders and hips should form a rectangle  that is parallel with the floor and is not twisted.  Keeping your trunk steady, lift your right hand no higher than your shoulder and then your left leg no higher than your hip. Make sure you are not holding your breath. Hold this position __________ seconds.  Continuing to keep your abdominal muscles tense and your back steady, slowly return to your starting position. Repeat with the opposite arm and leg. Repeat __________ times. Complete this exercise __________ times per day.  STRENGTHENING - Abdominals and Quadriceps, Straight Leg Raise   Lie on a firm bed or floor with both legs extended in front of you.  Keeping one leg in contact with the floor, bend the other knee so that your foot can rest flat on the floor.  Find your neutral spine, and tense your abdominal muscles to maintain your spinal position throughout the exercise.  Slowly lift your straight leg off the floor about 6 inches for a count of 15, making sure to not hold your breath.  Still keeping your neutral spine, slowly lower your leg all the way to the floor. Repeat this exercise with each leg __________ times. Complete this exercise __________ times per day. POSTURE AND BODY MECHANICS CONSIDERATIONS - Sciatica Keeping correct posture when sitting, standing or completing your activities will reduce the stress put on different body tissues, allowing injured tissues a chance to heal and limiting painful experiences. The following are general guidelines for improved posture. Your physician or physical therapist will provide you with any instructions specific to your needs. While reading these guidelines, remember:  The exercises prescribed by your provider will help you have the flexibility and strength to maintain correct postures.  The correct posture provides the optimal environment for your joints to work. All of your joints have less wear and tear when properly supported by a spine with good posture. This means you will  experience a healthier, less painful body.  Correct posture must be practiced with all of your activities, especially prolonged sitting and standing. Correct posture is as important when doing repetitive low-stress activities (typing) as it is when doing a single heavy-load activity (lifting). RESTING POSITIONS Consider which positions are most painful for you when choosing a resting position. If you have pain with flexion-based activities (sitting, bending, stooping, squatting), choose a position that allows you to rest in a less flexed posture. You would want to avoid curling into a fetal position on your side. If your pain worsens with extension-based activities (prolonged standing, working overhead), avoid resting in an extended position such as sleeping on your stomach. Most people will find more comfort when they rest with their spine in a more neutral position, neither too rounded nor too   arched. Lying on a non-sagging bed on your side with a pillow between your knees, or on your back with a pillow under your knees will often provide some relief. Keep in mind, being in any one position for a prolonged period of time, no matter how correct your posture, can still lead to stiffness. PROPER SITTING POSTURE In order to minimize stress and discomfort on your spine, you must sit with correct posture Sitting with good posture should be effortless for a healthy body. Returning to good posture is a gradual process. Many people can work toward this most comfortably by using various supports until they have the flexibility and strength to maintain this posture on their own. When sitting with proper posture, your ears will fall over your shoulders and your shoulders will fall over your hips. You should use the back of the chair to support your upper back. Your low back will be in a neutral position, just slightly arched. You may place a small pillow or folded towel at the base of your low back for support.  When  working at a desk, create an environment that supports good, upright posture. Without extra support, muscles fatigue and lead to excessive strain on joints and other tissues. Keep these recommendations in mind: CHAIR:   A chair should be able to slide under your desk when your back makes contact with the back of the chair. This allows you to work closely.  The chair's height should allow your eyes to be level with the upper part of your monitor and your hands to be slightly lower than your elbows. BODY POSITION  Your feet should make contact with the floor. If this is not possible, use a foot rest.  Keep your ears over your shoulders. This will reduce stress on your neck and low back. INCORRECT SITTING POSTURES   If you are feeling tired and unable to assume a healthy sitting posture, do not slouch or slump. This puts excessive strain on your back tissues, causing more damage and pain. Healthier options include:  Using more support, like a lumbar pillow.  Switching tasks to something that requires you to be upright or walking.  Talking a brief walk.  Lying down to rest in a neutral-spine position. PROLONGED STANDING WHILE SLIGHTLY LEANING FORWARD  When completing a task that requires you to lean forward while standing in one place for a long time, place either foot up on a stationary 2-4 inch high object to help maintain the best posture. When both feet are on the ground, the low back tends to lose its slight inward curve. If this curve flattens (or becomes too large), then the back and your other joints will experience too much stress, fatigue more quickly and can cause pain.  CORRECT STANDING POSTURES Proper standing posture should be assumed with all daily activities, even if they only take a few moments, like when brushing your teeth. As in sitting, your ears should fall over your shoulders and your shoulders should fall over your hips. You should keep a slight tension in your abdominal  muscles to brace your spine. Your tailbone should point down to the ground, not behind your body, resulting in an over-extended swayback posture.  INCORRECT STANDING POSTURES  Common incorrect standing postures include a forward head, locked knees and/or an excessive swayback. WALKING Walk with an upright posture. Your ears, shoulders and hips should all line-up. PROLONGED ACTIVITY IN A FLEXED POSITION When completing a task that requires you to bend forward   at your waist or lean over a low surface, try to find a way to stabilize 3 of 4 of your limbs. You can place a hand or elbow on your thigh or rest a knee on the surface you are reaching across. This will provide you more stability so that your muscles do not fatigue as quickly. By keeping your knees relaxed, or slightly bent, you will also reduce stress across your low back. CORRECT LIFTING TECHNIQUES DO :   Assume a wide stance. This will provide you more stability and the opportunity to get as close as possible to the object which you are lifting.  Tense your abdominals to brace your spine; then bend at the knees and hips. Keeping your back locked in a neutral-spine position, lift using your leg muscles. Lift with your legs, keeping your back straight.  Test the weight of unknown objects before attempting to lift them.  Try to keep your elbows locked down at your sides in order get the best strength from your shoulders when carrying an object.  Always ask for help when lifting heavy or awkward objects. INCORRECT LIFTING TECHNIQUES DO NOT:   Lock your knees when lifting, even if it is a small object.  Bend and twist. Pivot at your feet or move your feet when needing to change directions.  Assume that you cannot safely pick up a paperclip without proper posture.   This information is not intended to replace advice given to you by your health care provider. Make sure you discuss any questions you have with your health care provider.     Document Released: 09/19/2005 Document Revised: 02/03/2015 Document Reviewed: 01/01/2009 [employer] Interactive Patient Education 2016 [employer]  

## 2016-07-01 NOTE — Progress Notes (Signed)
   Subjective:    Patient ID: Rebecca Delacruz, female    DOB: Apr 12, 1986, 30 y.o.   MRN: [id number]  Back Pain  This is a new problem. The current episode started in the past 7 days. The problem occurs constantly. The problem is unchanged. The pain is present in the gluteal. The quality of the pain is described as aching. The pain radiates to the right thigh and right knee. The pain is at a severity of 10/10. The pain is severe. The pain is worse during the night. The symptoms are aggravated by bending and sitting. Associated symptoms include leg pain, numbness and tingling. Pertinent negatives include no bladder incontinence or bowel incontinence. She has tried NSAIDs, ice and bed rest for the symptoms. The treatment provided mild relief.      Review of Systems  Gastrointestinal: Negative for bowel incontinence.  Genitourinary: Negative for bladder incontinence.  Musculoskeletal: Positive for back pain.  Neurological: Positive for tingling and numbness.  All other systems reviewed and are negative.      Objective:   Physical Exam  Constitutional: She is oriented to person, place, and time. She appears well-developed and well-nourished. No distress.  HENT:  Head: Normocephalic and atraumatic.  Eyes: Pupils are equal, round, and reactive to light.  Neck: Normal range of motion. Neck supple. No thyromegaly present.  Cardiovascular: Normal rate, regular rhythm, normal heart sounds and intact distal pulses.   No murmur heard. Pulmonary/Chest: Effort normal and breath sounds normal. No respiratory distress. She has no wheezes.  Abdominal: Soft. Bowel sounds are normal. She exhibits no distension. There is no tenderness.  Musculoskeletal: Normal range of motion. She exhibits tenderness. She exhibits no edema.  Tenderness in right leg  Neurological: She is alert and oriented to person, place, and time. She has normal reflexes. No cranial nerve deficit.  Skin: Skin is warm and dry.    Psychiatric: She has a normal mood and affect. Her behavior is normal. Judgment and thought content normal.  Vitals reviewed.     BP (!) 102/57   Pulse 76   Temp (!) 96 F (35.6 C) (Oral)   Ht 5\' 2"  (1.575 m)   Wt 182 lb 3.2 oz (82.6 kg)   LMP 05/28/2016 (Approximate)   BMI 33.32 kg/m      Assessment & Plan:  1. Sciatica of right side -Rest -Ice -ROM exercises discussed -RTO prn  - ketorolac (TORADOL) injection 60 mg; Inject 2 mLs (60 mg total) into the muscle once. - predniSONE (STERAPRED UNI-PAK 21 TAB) 10 MG (21) TBPK tablet; Use as directed  Dispense: 21 tablet; Refill: 0 - naproxen (NAPROSYN) 500 MG tablet; Take 1 tablet (500 mg total) by mouth 2 (two) times daily with a meal.  Dispense: 60 tablet; Refill: 1 - cyclobenzaprine (FLEXERIL) 10 MG tablet; Take 1 tablet (10 mg total) by mouth 3 (three) times daily as needed for muscle spasms.  Dispense: 30 tablet; Refill: 0  Evelina Dun, FNP

## 2016-07-04 ENCOUNTER — Telehealth: Payer: Self-pay | Admitting: Family

## 2016-07-04 ENCOUNTER — Ambulatory Visit (INDEPENDENT_AMBULATORY_CARE_PROVIDER_SITE_OTHER): Payer: Medicaid Other

## 2016-07-04 ENCOUNTER — Ambulatory Visit (INDEPENDENT_AMBULATORY_CARE_PROVIDER_SITE_OTHER): Payer: Medicaid Other | Admitting: Podiatry

## 2016-07-04 VITALS — BP 108/71 | HR 78

## 2016-07-04 DIAGNOSIS — L84 Corns and callosities: Secondary | ICD-10-CM

## 2016-07-04 DIAGNOSIS — L851 Acquired keratosis [keratoderma] palmaris et plantaris: Secondary | ICD-10-CM | POA: Diagnosis not present

## 2016-07-04 DIAGNOSIS — M79672 Pain in left foot: Secondary | ICD-10-CM

## 2016-07-04 DIAGNOSIS — M79671 Pain in right foot: Secondary | ICD-10-CM

## 2016-07-04 NOTE — Telephone Encounter (Signed)
Patient states medication for sciatica is not helping please advise.

## 2016-07-04 NOTE — Progress Notes (Signed)
   Subjective:    Patient ID: Rebecca Delacruz, female    DOB: Apr 03, 1986, 30 y.o.   MRN: [id number]  HPI    Review of Systems  Eyes: Positive for itching.  Respiratory: Positive for wheezing.   Cardiovascular: Positive for leg swelling.  Musculoskeletal: Positive for back pain.  Neurological: Positive for headaches.       Objective:   Physical Exam        Assessment & Plan:

## 2016-07-04 NOTE — Progress Notes (Signed)
Patient ID: Rebecca Delacruz, female   DOB: 05-Dec-1985, 30 y.o.   MRN: [id number] Subjective: Patient presents to the office today for chief complaint of painful callus lesions of the feet. Patient states that the pain is ongoing and is affecting their ability to ambulate without pain. Patient presents today for further treatment and evaluation.  Objective:  Physical Exam General: Alert and oriented x3 in no acute distress  Dermatology: Hyperkeratotic lesion present on the bilateral aspects of the great toes and sub-fifth MPJs. Patient also has a hyperkeratotic lesion present on the fifth metatarsal tuberosity of the left foot.. Pain on palpation with a central nucleated core noted.  Skin is warm, dry and supple bilateral lower extremities. Negative for open lesions or macerations.  Vascular: Palpable pedal pulses bilaterally. No edema or erythema noted. Capillary refill within normal limits.  Neurological: Epicritic and protective threshold grossly intact bilaterally.   Musculoskeletal Exam: Pain on palpation at the keratotic lesion noted. Range of motion within normal limits bilateral. Muscle strength 5/5 in all groups bilateral.  Assessment: #1 painful callus lesions sub-fifth MPJs bilateral #2 painful callus lesions plantar aspect of the hallux bilateral #3 painful callus lesion fifth metatarsal tuberosity left foot  #4 pain in bilateral feet   Plan of Care:  #1 Patient evaluated #2 Excisional debridement of  keratoic lesion using a chisel blade was performed without incident.  #3 Treated area(s) with Salinocaine and dressed with light dressing. #4 Patient is to return to the clinic PRN.   Edrick Kins, [place]

## 2016-07-05 ENCOUNTER — Telehealth: Payer: Self-pay | Admitting: Family Medicine

## 2016-07-05 ENCOUNTER — Other Ambulatory Visit: Payer: Self-pay | Admitting: Family Medicine

## 2016-07-05 DIAGNOSIS — J45901 Unspecified asthma with (acute) exacerbation: Secondary | ICD-10-CM

## 2016-07-05 MED ORDER — TRAMADOL HCL 50 MG PO TABS: 50.0000 mg | ORAL_TABLET | Freq: Two times a day (BID) | ORAL | 0 refills | Status: DC | PRN

## 2016-07-05 NOTE — Telephone Encounter (Signed)
Please call in Ultram rx

## 2016-07-05 NOTE — Telephone Encounter (Signed)
RX called into [facility].

## 2016-07-05 NOTE — Telephone Encounter (Signed)
Closing encounter these were done through escripts  Pt aware Rxs sent to pharmacy

## 2016-07-06 ENCOUNTER — Telehealth: Payer: Self-pay | Admitting: *Deleted

## 2016-07-06 NOTE — Telephone Encounter (Signed)
Tried to contact patient, no answer and no vm.

## 2016-07-06 NOTE — Telephone Encounter (Signed)
Refer as requested

## 2016-07-06 NOTE — Telephone Encounter (Signed)
Patient saw Rebecca Delacruz for back pain.  She prefer you do a referral to any orthopedic clinic other than the [facility].  She has ask for a referral before and did not like [facility].  Please advise.

## 2016-07-18 ENCOUNTER — Telehealth: Payer: Self-pay | Admitting: Family Medicine

## 2016-07-19 ENCOUNTER — Other Ambulatory Visit: Payer: Self-pay | Admitting: *Deleted

## 2016-07-19 DIAGNOSIS — M549 Dorsalgia, unspecified: Secondary | ICD-10-CM

## 2016-07-19 NOTE — Telephone Encounter (Signed)
Referral placed.

## 2016-07-19 NOTE — Telephone Encounter (Signed)
Please address

## 2016-07-19 NOTE — Telephone Encounter (Signed)
Please refer as requested 

## 2016-07-25 ENCOUNTER — Other Ambulatory Visit: Payer: Self-pay | Admitting: Family

## 2016-07-25 ENCOUNTER — Other Ambulatory Visit: Payer: Self-pay | Admitting: Family Medicine

## 2016-07-25 ENCOUNTER — Telehealth: Payer: Self-pay | Admitting: Family Medicine

## 2016-07-25 MED ORDER — FLUCONAZOLE 150 MG PO TABS: 150.0000 mg | ORAL_TABLET | Freq: Once | ORAL | 0 refills | Status: AC

## 2016-07-25 NOTE — Telephone Encounter (Signed)
Please contact the patient . Tell patient she can  rest assured that the tramadol did not cause her yeast infection. She should continue taking it. I will send in an antibiotic for the yeast.

## 2016-07-25 NOTE — Telephone Encounter (Signed)
Pt aware & knows medication sent to pharmacy

## 2016-07-27 ENCOUNTER — Telehealth: Payer: Self-pay | Admitting: Family Medicine

## 2016-07-27 NOTE — Telephone Encounter (Signed)
Tried to contact patient and no answer.  

## 2016-08-03 NOTE — Telephone Encounter (Signed)
Tried to contact patient several times and number is not accepting calls. This encounter will be closed.

## 2016-08-04 ENCOUNTER — Other Ambulatory Visit: Payer: Self-pay

## 2016-08-04 ENCOUNTER — Ambulatory Visit (INDEPENDENT_AMBULATORY_CARE_PROVIDER_SITE_OTHER): Payer: Medicaid Other | Admitting: Family Medicine

## 2016-08-04 VITALS — BP 107/77 | HR 79 | Temp 97.7°F | Ht 62.0 in | Wt 188.0 lb

## 2016-08-04 DIAGNOSIS — S61232A Puncture wound without foreign body of right middle finger without damage to nail, initial encounter: Secondary | ICD-10-CM | POA: Diagnosis not present

## 2016-08-04 DIAGNOSIS — Z23 Encounter for immunization: Secondary | ICD-10-CM

## 2016-08-04 DIAGNOSIS — M545 Low back pain: Secondary | ICD-10-CM

## 2016-08-04 MED ORDER — IBUPROFEN 800 MG PO TABS: 800.0000 mg | ORAL_TABLET | Freq: Three times a day (TID) | ORAL | 0 refills | Status: DC | PRN

## 2016-08-04 NOTE — Progress Notes (Signed)
Subjective:  Patient ID: Rebecca Delacruz, female    DOB: 1986/04/12  Age: 30 y.o. MRN: [id number]  CC: cut finger on rusty window (Wants tetanus shot) and Back Pain   HPI LEZLEE EKBERG presents for Patient requests opiates for back pain. She has an upcoming appointment with orthopedics. She has been given tramadol by another provider in this office. She does not understand that she needs to get her opiates from the same provider  Patient cut her finger approximately 3 hours ago by a running get over a rusty windowsill. There was some break in the skin. Minimal bleeding.  History Korene has a past medical history of Asthma; Back pain; Constipation; Gonorrhea; No pertinent past medical history; Ovarian cyst; Scoliosis; Shortness of breath; and Trichomonas.   She has a past surgical history that includes No past surgeries and Tubal ligation (10/15/2011).   Her family history includes Diabetes in her mother.She reports that she has been smoking Cigarettes.  She has a 5.00 pack-year smoking history. She has never used smokeless tobacco. She reports that she drinks alcohol. She reports that she does not use drugs.  Current Outpatient Prescriptions on File Prior to Visit  Medication Sig Dispense Refill  . albuterol (PROVENTIL) (2.5 MG/3ML) 0.083% nebulizer solution Take 3 mLs (2.5 mg total) by nebulization every 6 (six) hours as needed for wheezing or shortness of breath. 150 mL 1  . PROAIR HFA 108 (90 Base) MCG/ACT inhaler INHALE 2 PUFFS INTO THE LUNGS EVERY 6 (SIX) HOURS AS NEEDED FOR WHEEZING. 8.5 Inhaler 0  . SYMBICORT 160-4.5 MCG/ACT inhaler INHALE 2 PUFFS INTO THE LUNGS 2 (TWO) TIMES DAILY. 10.2 Inhaler 2  . traMADol (ULTRAM) 50 MG tablet Take 1 tablet (50 mg total) by mouth every 12 (twelve) hours as needed. (Patient not taking: Reported on 08/04/2016) 60 tablet 0   No current facility-administered medications on file prior to visit.     ROS Review of Systems As per history of  present illness Objective:  BP 107/77   Pulse 79   Temp 97.7 F (36.5 C) (Oral)   Ht 5\' 2"  (1.575 m)   Wt 188 lb (85.3 kg)   BMI 34.39 kg/m   Physical Exam  Constitutional: She is oriented to person, place, and time. She appears well-developed and well-nourished. No distress.  Cardiovascular: Normal rate and regular rhythm.   Pulmonary/Chest: Breath sounds normal.  Neurological: She is alert and oriented to person, place, and time.  Skin: Skin is warm and dry.  There is a 1 mm puncture at the distal right third finger. There is a dot of blood at the surface.  Psychiatric: She has a normal mood and affect.    Assessment & Plan:   Shanne was seen today for cut finger on rusty window and back pain.  Diagnoses and all orders for this visit:  Puncture wound of right middle finger  Other orders -     ibuprofen (ADVIL,MOTRIN) 800 MG tablet; Take 1 tablet (800 mg total) by mouth every 8 (eight) hours as needed.   I have discontinued Ms. Standifer's predniSONE, naproxen, and cyclobenzaprine. I am also having her start on ibuprofen. Additionally, I am having her maintain her albuterol, traMADol, SYMBICORT, and PROAIR HFA.  Meds ordered this encounter  Medications  . ibuprofen (ADVIL,MOTRIN) 800 MG tablet    Sig: Take 1 tablet (800 mg total) by mouth every 8 (eight) hours as needed.    Dispense:  30 tablet    Refill:  0  Wound cleansed. Neosporin and gauze applied. TDaP administered. Follow-up: Return if symptoms worsen or fail to improve.  Claretta Fraise, M.D.

## 2016-08-04 NOTE — Patient Instructions (Signed)
He will need to see the same provider for her opiates each time you were seen in this office. For now that is Rebecca Delacruz's. He may want to call and make an appointment with her.

## 2016-08-05 NOTE — Addendum Note (Signed)
Addended by: Rolena Infante on: 08/05/2016 08:54 AM   Modules accepted: Orders

## 2016-08-15 ENCOUNTER — Emergency Department (HOSPITAL_COMMUNITY)
Admission: EM | Admit: 2016-08-15 | Discharge: 2016-08-15 | Disposition: A | Discharge status: Home / Self Care | Payer: Medicaid Other | Attending: Emergency Medicine | Admitting: Emergency Medicine

## 2016-08-15 ENCOUNTER — Encounter (HOSPITAL_COMMUNITY): Payer: Self-pay | Admitting: Emergency Medicine

## 2016-08-15 ENCOUNTER — Ambulatory Visit (HOSPITAL_COMMUNITY)
Admission: RE | Admit: 2016-08-15 | Discharge: 2016-08-15 | Disposition: A | Discharge status: Home / Self Care | Payer: Medicaid Other | Source: Ambulatory Visit | Attending: Family Medicine | Admitting: Family Medicine

## 2016-08-15 ENCOUNTER — Emergency Department (HOSPITAL_COMMUNITY): Payer: Medicaid Other

## 2016-08-15 DIAGNOSIS — Z79899 Other long term (current) drug therapy: Secondary | ICD-10-CM | POA: Diagnosis not present

## 2016-08-15 DIAGNOSIS — M5440 Lumbago with sciatica, unspecified side: Secondary | ICD-10-CM | POA: Diagnosis present

## 2016-08-15 DIAGNOSIS — J209 Acute bronchitis, unspecified: Secondary | ICD-10-CM | POA: Insufficient documentation

## 2016-08-15 DIAGNOSIS — M545 Low back pain, unspecified: Secondary | ICD-10-CM

## 2016-08-15 DIAGNOSIS — F17210 Nicotine dependence, cigarettes, uncomplicated: Secondary | ICD-10-CM | POA: Insufficient documentation

## 2016-08-15 DIAGNOSIS — M5126 Other intervertebral disc displacement, lumbar region: Secondary | ICD-10-CM | POA: Diagnosis not present

## 2016-08-15 DIAGNOSIS — M5136 Other intervertebral disc degeneration, lumbar region: Secondary | ICD-10-CM | POA: Insufficient documentation

## 2016-08-15 DIAGNOSIS — J45909 Unspecified asthma, uncomplicated: Secondary | ICD-10-CM | POA: Diagnosis not present

## 2016-08-15 DIAGNOSIS — R05 Cough: Secondary | ICD-10-CM | POA: Diagnosis present

## 2016-08-15 MED ORDER — AZITHROMYCIN 250 MG PO TABS: 250.0000 mg | ORAL_TABLET | Freq: Every day | ORAL | 0 refills | Status: DC

## 2016-08-15 MED ORDER — METHOCARBAMOL 500 MG PO TABS: 500.0000 mg | ORAL_TABLET | Freq: Two times a day (BID) | ORAL | 0 refills | Status: DC

## 2016-08-15 MED ORDER — BENZONATATE 100 MG PO CAPS: 100.0000 mg | ORAL_CAPSULE | Freq: Three times a day (TID) | ORAL | 0 refills | Status: DC

## 2016-08-15 NOTE — Discharge Instructions (Signed)
Return if any problems.  See your Physician for recheck  °

## 2016-08-15 NOTE — ED Triage Notes (Signed)
Pt reports R sided low back pain with radiation down her R leg. Pt states approx 1 week ago she bent over and picked up a case of water, "felt a pop."

## 2016-08-15 NOTE — ED Provider Notes (Signed)
[facility] DEPT Provider Note   CSN: [id number] Arrival date & time: 08/15/16  1445  By signing my name below, I, Emmanuella Mensah, attest that this documentation has been prepared under the direction and in the presence of Alyse Low, PA-C. Electronically Signed: Judithann Sauger, ED Scribe. 08/15/16. 4:05 PM.   History   Chief Complaint Chief Complaint  Patient presents with  . Back Pain    HPI Comments: [name] is a 30 y.o. female with a hx of asthma, scoliosis, and chronic back pain who presents to the Emergency Department complaining of gradually worsening moderate right lower back pain she describes as sharp that radiates down her anterior right leg onset one week ago. She explains that she heard a pop after bending over to pick up a case of water prior to onset of her symptoms. No alleviating factors noted. She states that she has tried 800 mg Ibuprofen PTA with minimal relief. Pt has an allergy to aspirin. She denies any bowel/bladder incontinence, urinary symptoms, numbness/tingling in BLE, or any other associated symptoms.   Pt also c/o persistent moderate non-productive cough onset 2 days ago. She denies any known sick contacts but states that she noticed she began coughing after going to the grocery store. She states that the coughing makes the back pain worse. No alleviating factors noted. She reports that she is a current 0.5 pack of cigarettes a day smoker. She denies any fever, chills, shortness of breath, nausea, or vomiting. No other complaints at this time.    The history is provided by the patient. No language interpreter was used.    Past Medical History:  Diagnosis Date  . Asthma   . Back pain   . Constipation   . Gonorrhea   . No pertinent past medical history   . Ovarian cyst   . Scoliosis   . Shortness of breath   . Trichomonas     Patient Active Problem List   Diagnosis Date Noted  . STD (female) 04/20/2016  . Obesity (BMI 30-39.9)  04/15/2016  . 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 Scoliosis 06/10/2013  . Sterilization, postpartum 10/15/2011    Past Surgical History:  Procedure Laterality Date  . NO PAST SURGERIES    . TUBAL LIGATION  10/15/2011   Procedure: POST PARTUM TUBAL LIGATION;  Surgeon: Jonnie Kind, MD;  Location: [facility];  Service: Gynecology;  Laterality: Bilateral;  Post partum tubal ligation bilateral with filshie clips    OB History    Gravida Para Term Preterm AB Living   5 4 3 1 1 3    SAB TAB Ectopic Multiple Live Births           1      Obstetric Comments   Pt reported Abruption as part of del of FDIU, in 2010, but records deny any mention of abruption.  Pt had known FDIU prior to presentation in active labor, delivering promptly upon arrival  Urine Drug screen neg at time of del.       Home Medications    Prior to Admission medications   Medication Sig Start Date End Date Taking? Authorizing Provider  albuterol (PROVENTIL) (2.5 MG/3ML) 0.083% nebulizer solution Take 3 mLs (2.5 mg total) by nebulization every 6 (six) hours as needed for wheezing or shortness of breath. 04/17/15   Claretta Fraise, MD  ibuprofen (ADVIL,MOTRIN) 800 MG tablet Take 1 tablet (800 mg total) by mouth every 8 (eight) hours as needed. 08/04/16   Claretta Fraise, MD  PROAIR HFA 108 (90 Base) MCG/ACT inhaler INHALE 2 PUFFS INTO THE LUNGS EVERY 6 (SIX) HOURS AS NEEDED FOR WHEEZING. 07/05/16   Claretta Fraise, MD  SYMBICORT 160-4.5 MCG/ACT inhaler INHALE 2 PUFFS INTO THE LUNGS 2 (TWO) TIMES DAILY. 07/05/16   Claretta Fraise, MD  traMADol (ULTRAM) 50 MG tablet Take 1 tablet (50 mg total) by mouth every 12 (twelve) hours as needed. Patient not taking: Reported on 08/04/2016 07/05/16   Sharion Balloon, FNP    Family History Family History  Problem Relation Age of Onset  . Diabetes Mother   . Anesthesia problems Neg Hx   . Hypotension Neg Hx   . Malignant hyperthermia Neg Hx   . Pseudochol deficiency Neg Hx     Social  History Social History  Substance Use Topics  . Smoking status: Current Every Day Smoker    Packs/day: 0.50    Years: 10.00    Types: Cigarettes  . Smokeless tobacco: Never Used  . Alcohol use 0.0 oz/week     Comment: 3 weeks ago     Allergies   Aspirin   Review of Systems Review of Systems  Constitutional: Negative for chills and fever.  Respiratory: Positive for cough. Negative for shortness of breath.   Gastrointestinal: Negative for nausea and vomiting.  Genitourinary: Negative for dysuria and hematuria.  Musculoskeletal: Positive for back pain.  Neurological: Negative for numbness.  All other systems reviewed and are negative.    Physical Exam Updated Vital Signs BP 117/74 (BP Location: Left Arm)   Pulse 94   Temp 98.2 F (36.8 C) (Oral)   Resp 20   Ht 5\' 2"  (1.575 m)   Wt 186 lb (84.4 kg)   LMP 08/03/2016   SpO2 100%   BMI 34.02 kg/m   Physical Exam  Constitutional: She is oriented to person, place, and time. She appears well-developed and well-nourished. No distress.  HENT:  Head: Normocephalic and atraumatic.  Eyes: Conjunctivae and EOM are normal.  Neck: Neck supple. No tracheal deviation present.  Cardiovascular: Normal rate.   Pulmonary/Chest: Effort normal. No respiratory distress. She has rhonchi.  Rhonchi in bilateral lower lobes  Musculoskeletal: Normal range of motion. She exhibits tenderness.  Tender lumbar para-vertebral on the right   Neurological: She is alert and oriented to person, place, and time.  Skin: Skin is warm and dry.  Psychiatric: She has a normal mood and affect. Her behavior is normal.  Nursing note and vitals reviewed.    ED Treatments / Results  DIAGNOSTIC STUDIES: Oxygen Saturation is 100% on RA, normal by my interpretation.    COORDINATION OF CARE: 3:50 PM- Pt advised of plan for treatment and pt agrees. Pt will receive chest x-ray for further evaluation.    Labs (all labs ordered are listed, but only abnormal  results are displayed) Labs Reviewed - No data to display  EKG  EKG Interpretation None       Radiology Mr Lumbar Spine Wo Contrast  Result Date: 08/15/2016 CLINICAL DATA:  Low back pain radiating to legs for 6 months. EXAM: MRI LUMBAR SPINE WITHOUT CONTRAST TECHNIQUE: Multiplanar, multisequence MR imaging of the lumbar spine was performed. No intravenous contrast was administered. COMPARISON:  None. FINDINGS: Mild motion degraded examination. Decreased signal to noise ratio, attributed to larger body habitus. 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 ALIGNMENT: Maintenance of the lumbar lordosis. No malalignment. Mild broad levoscoliosis on this nonweightbearing examination. VERTEBRAE:Vertebral bodies are intact. Intervertebral  discs demonstrate normal morphology and signal characteristics. No abnormal bone marrow signal. CONUS MEDULLARIS: Conus medullaris terminates at T12-L1 and demonstrates normal morphology and signal characteristics. Cauda equina is normal. PARASPINAL AND SOFT TISSUES: Included prevertebral and paraspinal soft tissues are normal. Small fat containing umbilical hernia. DISC LEVELS: T11-12 through L3-4: No disc bulge, canal stenosis nor neural foraminal narrowing. L4-5: Small broad-based disc bulge. No canal stenosis. Minimal bilateral neural foraminal narrowing. L5-S1: No disc bulge, canal stenosis nor neural foraminal narrowing. IMPRESSION: Transitional anatomy, sacralized LEFT L5 vertebral body. Minimal bilateral L4-5 neural foraminal narrowing. Electronically Signed   By: Elon Alas M.D.   On: 08/15/2016 14:55    Procedures Procedures (including critical care time)  Medications Ordered in ED Medications - No data to display   Initial Impression / Assessment and Plan / ED Course  Alyse Low, PA-C has reviewed the triage vital signs and the nursing notes.  Pertinent labs &  imaging results that were available during my care of the patient were reviewed by me and considered in my medical decision making (see chart for details).  Clinical Course      Patient with back pain.  No neurological deficits and normal neuro exam.  Patient can walk but states is painful.  No loss of bowel or bladder control.  No concern for cauda equina.  No fever, night sweats, weight loss, h/o cancer, IVDU.  RICE protocol and pain medicine indicated and discussed with patient.    Pt CXR negative for acute infiltrate. Patients symptoms are consistent with URI, likely viral etiology. Discussed that antibiotics are not indicated for viral infections. Pt will be discharged with symptomatic treatment.  Verbalizes understanding and is agreeable with plan. Pt is hemodynamically stable & in NAD prior to dc.   Final Clinical Impressions(s) / ED Diagnoses   Final diagnoses:  Acute bronchitis, unspecified organism  Acute right-sided low back pain without sciatica    New Prescriptions Discharge Medication List as of 08/15/2016  4:50 PM    START taking these medications   Details  azithromycin (ZITHROMAX) 250 MG tablet Take 1 tablet (250 mg total) by mouth daily. Take first 2 tablets together, then 1 every day until finished., Starting Mon 08/15/2016, Print    benzonatate (TESSALON) 100 MG capsule Take 1 capsule (100 mg total) by mouth every 8 (eight) hours., Starting Mon 08/15/2016, Print    methocarbamol (ROBAXIN) 500 MG tablet Take 1 tablet (500 mg total) by mouth 2 (two) times daily., Starting Mon 08/15/2016, Print        I personally performed the services in this documentation, which was scribed in my presence.  The recorded information has been reviewed and considered.   Ronnald Collum.  I personally performed the services in this documentation, which was scribed in my presence.  The recorded information has been reviewed and considered.   Ronnald Collum.   Hollace Kinnier Hatfield,  PA-C 08/16/16 [id number]    Veryl Speak, MD 08/16/16 [phone]

## 2016-08-16 ENCOUNTER — Telehealth: Payer: Self-pay | Admitting: Family Medicine

## 2016-08-16 NOTE — Telephone Encounter (Signed)
Call will be forwarded to PCP, Received late tonight.   Laroy Apple, MD [facility] 08/16/2016, 6:06 PM

## 2016-10-26 ENCOUNTER — Ambulatory Visit: Payer: Medicaid Other | Admitting: Podiatry

## 2016-10-28 ENCOUNTER — Telehealth: Payer: Self-pay

## 2016-10-31 NOTE — Telephone Encounter (Signed)
x

## 2016-12-21 ENCOUNTER — Ambulatory Visit: Payer: Medicaid Other | Admitting: Family Medicine

## 2016-12-22 ENCOUNTER — Telehealth: Payer: Self-pay | Admitting: Family Medicine

## 2016-12-22 ENCOUNTER — Encounter: Payer: Self-pay | Admitting: Family Medicine

## 2016-12-26 ENCOUNTER — Encounter: Payer: Self-pay | Admitting: Family Medicine

## 2016-12-26 ENCOUNTER — Ambulatory Visit (INDEPENDENT_AMBULATORY_CARE_PROVIDER_SITE_OTHER): Payer: Medicaid Other | Admitting: Family Medicine

## 2016-12-26 VITALS — BP 99/68 | HR 84 | Temp 98.0°F | Ht 62.0 in | Wt 193.0 lb

## 2016-12-26 DIAGNOSIS — R631 Polydipsia: Secondary | ICD-10-CM | POA: Diagnosis not present

## 2016-12-26 DIAGNOSIS — J4540 Moderate persistent asthma, uncomplicated: Secondary | ICD-10-CM

## 2016-12-26 DIAGNOSIS — J45909 Unspecified asthma, uncomplicated: Secondary | ICD-10-CM | POA: Insufficient documentation

## 2016-12-26 LAB — BAYER DCA HB A1C WAIVED: HB A1C: 5.3 % (ref <7.0)

## 2016-12-26 MED ORDER — LEVOCETIRIZINE DIHYDROCHLORIDE 5 MG PO TABS: 5.0000 mg | ORAL_TABLET | Freq: Every evening | ORAL | 3 refills | Status: DC

## 2016-12-26 MED ORDER — BETAMETHASONE SOD PHOS & ACET 6 (3-3) MG/ML IJ SUSP: 6.0000 mg | Freq: Once | INTRAMUSCULAR | Status: DC

## 2016-12-26 NOTE — Telephone Encounter (Signed)
Pt was seen in office today

## 2016-12-26 NOTE — Progress Notes (Signed)
Subjective:  Patient ID: Rebecca Delacruz, female    DOB: 1985/11/18  Age: 31 y.o. MRN: [id number]  CC: Asthma (pt here today for follow up on her asthma and needs to discuss her inhalers. She is also requesting a HGB A1C due to family history of Diabetes and increased thirst and callus formation on her feet.)   HPI Rebecca Delacruz presents for Patient has allergic rhinitis symptoms including sneezing frequently sniffling, clear rhinorrhea, watery and itchy eyes. There has been no fever no chills no sweats. No earaches. There is some scratchy throat but no sore throat or difficulty swallowing. There is some nasal congestion. Onset about a month ago. She has had increased wheezing during that time. She's been using extra puffs from the Symbicort because her chest was tight. The Symbicort and give her the relief she needed. She does not have the albuterol neb solution yet. She needs refills on ProAir and Symbicort.  Patient relates that she's had polydipsia. Also excessive nocturia. Her foot doctor told her that her foot calluses looked like those of somebody who is a diabetic and encouraged her to get that checked. She has been putting on some weight recently.   History Hisae has a past medical history of Asthma; Back pain; Constipation; Gonorrhea; No pertinent past medical history; Ovarian cyst; Scoliosis; Shortness of breath; and Trichomonas.   She has a past surgical history that includes No past surgeries and Tubal ligation (10/15/2011).   Her family history includes Diabetes in her mother.She reports that she has been smoking Cigarettes.  She has a 5.00 pack-year smoking history. She has never used smokeless tobacco. She reports that she drinks alcohol. She reports that she does not use drugs.    ROS Review of Systems  Constitutional: Negative for activity change, appetite change and fever.  HENT: Positive for congestion, postnasal drip, rhinorrhea and sneezing. Negative for sore  throat.   Eyes: Negative for visual disturbance.  Respiratory: Positive for cough, chest tightness and shortness of breath.   Cardiovascular: Negative for chest pain and palpitations.  Gastrointestinal: Negative for abdominal pain, diarrhea and nausea.  Endocrine: Positive for polydipsia and polyuria. Negative for polyphagia.  Genitourinary: Negative for dysuria.  Musculoskeletal: Negative for arthralgias and myalgias.    Objective:  BP 99/68   Pulse 84   Temp 98 F (36.7 C) (Oral)   Ht 5\' 2"  (1.575 m)   Wt 193 lb (87.5 kg)   LMP 12/01/2016 (Approximate)   BMI 35.30 kg/m   BP Readings from Last 3 Encounters:  12/26/16 99/68  08/15/16 110/58  08/04/16 107/77    Wt Readings from Last 3 Encounters:  12/26/16 193 lb (87.5 kg)  08/15/16 186 lb (84.4 kg)  08/04/16 188 lb (85.3 kg)     Physical Exam  Constitutional: She is oriented to person, place, and time. She appears well-developed and well-nourished. No distress.  HENT:  Head: Normocephalic and atraumatic.  Right Ear: External ear normal.  Left Ear: External ear normal.  Nose: Nose normal.  Mouth/Throat: Oropharynx is clear and moist.  Eyes: Conjunctivae and EOM are normal. Pupils are equal, round, and reactive to light.  Neck: Normal range of motion. Neck supple. No thyromegaly present.  Cardiovascular: Normal rate, regular rhythm and normal heart sounds.   No murmur heard. Pulmonary/Chest: Effort normal and breath sounds normal. No respiratory distress. She has no wheezes. She has no rales.  Abdominal: Soft. Bowel sounds are normal. She exhibits no distension. There is no tenderness.  Lymphadenopathy:    She has no cervical adenopathy.  Neurological: She is alert and oriented to person, place, and time. She has normal reflexes.  Skin: Skin is warm and dry.  Psychiatric: She has a normal mood and affect. Her behavior is normal. Judgment and thought content normal.      Assessment & Plan:   Evy was seen  today for asthma.  Diagnoses and all orders for this visit:  Polydipsia -     Bayer DCA Hb A1c Waived -     betamethasone acetate-betamethasone sodium phosphate (CELESTONE) injection 6 mg; Inject 1 mL (6 mg total) into the muscle once.  Moderate persistent asthma without complication -     PR BREATHING CAPACITY TEST  Other orders -     levocetirizine (XYZAL) 5 MG tablet; Take 1 tablet (5 mg total) by mouth every evening. For allergy symptoms       I have discontinued Ms. Nygaard's traMADol, ibuprofen, azithromycin, benzonatate, and methocarbamol. I am also having her start on levocetirizine. Additionally, I am having her maintain her albuterol, SYMBICORT, and PROAIR HFA. We will continue to administer betamethasone acetate-betamethasone sodium phosphate.  Allergies as of 12/26/2016      Reactions   Aspirin Palpitations      Medication List       Accurate as of 12/26/16  3:03 PM. Always use your most recent med list.          albuterol (2.5 MG/3ML) 0.083% nebulizer solution Commonly known as:  PROVENTIL Take 3 mLs (2.5 mg total) by nebulization every 6 (six) hours as needed for wheezing or shortness of breath.   PROAIR HFA 108 (90 Base) MCG/ACT inhaler Generic drug:  albuterol INHALE 2 PUFFS INTO THE LUNGS EVERY 6 (SIX) HOURS AS NEEDED FOR WHEEZING.   levocetirizine 5 MG tablet Commonly known as:  XYZAL Take 1 tablet (5 mg total) by mouth every evening. For allergy symptoms   SYMBICORT 160-4.5 MCG/ACT inhaler Generic drug:  budesonide-formoterol INHALE 2 PUFFS INTO THE LUNGS 2 (TWO) TIMES DAILY.      Due to strong family history encouraged the patient to lose weight to avoid diabetes. This should also help her with her foot care.  PFT in normal range. However she is actively using her inhalers regularly.  Hemoglobin A1c 5.3, nondiabetic range. Follow-up: No Follow-up on file.  Claretta Fraise, M.D.

## 2016-12-29 ENCOUNTER — Telehealth: Payer: Self-pay

## 2016-12-29 DIAGNOSIS — J309 Allergic rhinitis, unspecified: Secondary | ICD-10-CM

## 2016-12-29 MED ORDER — CETIRIZINE HCL 10 MG PO TABS: 10.0000 mg | ORAL_TABLET | Freq: Every day | ORAL | 11 refills | Status: DC

## 2016-12-29 NOTE — Telephone Encounter (Signed)
Sent in cetirizine

## 2017-01-03 ENCOUNTER — Telehealth: Payer: Self-pay | Admitting: Family Medicine

## 2017-01-03 NOTE — Telephone Encounter (Signed)
Please address

## 2017-01-04 NOTE — Telephone Encounter (Signed)
Pharmacy aware

## 2017-01-04 NOTE — Telephone Encounter (Signed)
10mg

## 2017-01-10 ENCOUNTER — Other Ambulatory Visit: Payer: Self-pay | Admitting: Family Medicine

## 2017-01-16 ENCOUNTER — Other Ambulatory Visit: Payer: Self-pay | Admitting: Family Medicine

## 2017-01-17 ENCOUNTER — Other Ambulatory Visit: Payer: Self-pay | Admitting: Family Medicine

## 2017-01-24 ENCOUNTER — Ambulatory Visit: Payer: Medicaid Other | Admitting: Advanced Practice Midwife

## 2017-01-25 ENCOUNTER — Other Ambulatory Visit: Payer: Self-pay | Admitting: *Deleted

## 2017-01-25 MED ORDER — ALBUTEROL SULFATE (2.5 MG/3ML) 0.083% IN NEBU: INHALATION_SOLUTION | RESPIRATORY_TRACT | 4 refills | Status: DC

## 2017-01-31 ENCOUNTER — Ambulatory Visit: Payer: Medicaid Other | Admitting: Advanced Practice Midwife

## 2017-04-25 ENCOUNTER — Other Ambulatory Visit: Payer: Self-pay | Admitting: Family

## 2017-04-25 DIAGNOSIS — S86911A Strain of unspecified muscle(s) and tendon(s) at lower leg level, right leg, initial encounter: Secondary | ICD-10-CM

## 2017-04-27 ENCOUNTER — Other Ambulatory Visit: Payer: Self-pay | Admitting: Family

## 2017-04-27 DIAGNOSIS — S86911A Strain of unspecified muscle(s) and tendon(s) at lower leg level, right leg, initial encounter: Secondary | ICD-10-CM

## 2017-11-13 ENCOUNTER — Ambulatory Visit: Payer: Medicaid Other | Admitting: Neurology

## 2017-11-13 ENCOUNTER — Encounter: Payer: Self-pay | Admitting: Neurology

## 2017-11-13 VITALS — BP 102/75 | HR 84 | Ht 62.0 in | Wt 240.8 lb

## 2017-11-13 DIAGNOSIS — R202 Paresthesia of skin: Secondary | ICD-10-CM | POA: Diagnosis not present

## 2017-11-13 DIAGNOSIS — R29818 Other symptoms and signs involving the nervous system: Secondary | ICD-10-CM | POA: Diagnosis not present

## 2017-11-13 DIAGNOSIS — R200 Anesthesia of skin: Secondary | ICD-10-CM

## 2017-11-13 DIAGNOSIS — M6281 Muscle weakness (generalized): Secondary | ICD-10-CM | POA: Diagnosis not present

## 2017-11-13 DIAGNOSIS — R29898 Other symptoms and signs involving the musculoskeletal system: Secondary | ICD-10-CM | POA: Diagnosis not present

## 2017-11-13 MED ORDER — TIZANIDINE HCL 4 MG PO CAPS: 4.0000 mg | ORAL_CAPSULE | Freq: Three times a day (TID) | ORAL | 2 refills | Status: DC | PRN

## 2017-11-13 NOTE — Patient Instructions (Addendum)
EMG/NCS MRI brain  Your physician has ordered a Nerve Conduction Study (NCV) and/or EMG testing.  This is a test to assess the status of your nerves and muscles. For the NCV portion of the test , sticky tabs will be placed on either your hands or feet.  Your nerves will be stimulated using small electrical charges and the speed of the impulse will be measured as it travels down the nerve. For the EMG portion of the test, a small pin will be placed below the surface of the skin to measure the electrical activity in certain muscles in your arms or legs. Eating/drinking prior to testing is ok.  Please DO NOT discontinue ANY medications prior to testing  Your appointment will be scheduled by a member of our team.  You will need to check in with the main reception desk. Your test could take approximately 30 minutes to 1 hour to complete depending on the extent of the test that has been ordered. TESTING ON LEGS/BACK/HIP/FOOT AREA: Bring/wear a pair of shorts.  **ABSOLUTELY NO LOTIONS, MOISTURIZERS, VASELINE, OILS OR CREAMS OF ANY KIND ON ANY BODY PART THE DAY OF THE TEST**  **DEODORANT IS OK TO WEAR**  Please make every effort to keep your scheduled appointment time, as your physician needs the test results prior to your next appointment.  If you have to cancel or reschedule, please do so as soon as possible, so that another patient may use that testing time slot.  If you cancel your appointment, you may also need to reschedule your referring doctor's appointment until the test and results can be completed.  Please call [phone] and ask for Dr. Romona Curls assistant if you need to do so.  If you have any questions at any time please let us know.  We look forward to working with you!!   Tizanidine tablets or capsules What is this medicine? TIZANIDINE (tye ZAN i deen) helps to relieve muscle spasms. It may be used to help in the treatment of multiple sclerosis and spinal cord injury. This  medicine may be used for other purposes; ask your health care provider or pharmacist if you have questions. COMMON BRAND NAME(S): Zanaflex What should I tell my health care provider before I take this medicine? They need to know if you have any of these conditions: -kidney disease -liver disease -low blood pressure -mental disorder -an unusual or allergic reaction to tizanidine, other medicines, lactose (tablets only), foods, dyes, or preservatives -pregnant or trying to get pregnant -breast-feeding How should I use this medicine? Take this medicine by mouth with a full glass of water. Take this medicine on an empty stomach, at least 30 minutes before or 2 hours after food. Do not take with food unless you talk with your doctor. Follow the directions on the prescription label. Take your medicine at regular intervals. Do not take your medicine more often than directed. Do not stop taking except on your doctor's advice. Suddenly stopping the medicine can be very dangerous. Talk to your pediatrician regarding the use of this medicine in children. Patients over 28 years old may have a stronger reaction and need a smaller dose. Overdosage: If you think you have taken too much of this medicine contact a poison control center or emergency room at once. NOTE: This medicine is only for you. Do not share this medicine with others. What if I miss a dose? If you miss a dose, take it as soon as you can. If it is almost time  for your next dose, take only that dose. Do not take double or extra doses. What may interact with this medicine? Do not take this medicine with any of the following medications: -ciprofloxacin -cisapride -dofetilide -dronedarone -fluvoxamine -narcotic medicines for cough -pimozide -thiabendazole -thioridazine -ziprasidone This medicine may also interact with the following medications: -acyclovir -alcohol -antihistamines for allergy, cough and cold -baclofen -certain  antibiotics like levofloxacin, ofloxacin -certain medicines for anxiety or sleep -certain medicines for blood pressure, heart disease, irregular heart beat -certain medicines for depression like amitriptyline, fluoxetine, sertraline -certain medicines for seizures like phenobarbital, primidone -certain medicines for stomach problems like cimetidine, famotidine -female hormones, like estrogens or progestins and birth control pills, patches, rings, or injections -general anesthetics like halothane, isoflurane, methoxyflurane, propofol -local anesthetics like lidocaine, pramoxine, tetracaine -medicines that relax muscles for surgery -narcotic medicines for pain -other medicines that prolong the QT interval (cause an abnormal heart rhythm) -phenothiazines like chlorpromazine, mesoridazine, prochlorperazine -ticlopidine -zileuton This list may not describe all possible interactions. Give your health care provider a list of all the medicines, herbs, non-prescription drugs, or dietary supplements you use. Also tell them if you smoke, drink alcohol, or use illegal drugs. Some items may interact with your medicine. What should I watch for while using this medicine? Tell your doctor or health care professional if your symptoms do not start to get better or if they get worse. You may get drowsy or dizzy. Do not drive, use machinery, or do anything that needs mental alertness until you know how this medicine affects you. Do not stand or sit up quickly, especially if you are an older patient. This reduces the risk of dizzy or fainting spells. Alcohol may interfere with the effect of this medicine. Avoid alcoholic drinks. If you are taking another medicine that also causes drowsiness, you may have more side effects. Give your health care provider a list of all medicines you use. Your doctor will tell you how much medicine to take. Do not take more medicine than directed. Call emergency for help if you have  problems breathing or unusual sleepiness. Your mouth may get dry. Chewing sugarless gum or sucking hard candy, and drinking plenty of water may help. Contact your doctor if the problem does not go away or is severe. What side effects may I notice from receiving this medicine? Side effects that you should report to your doctor or health care professional as soon as possible: -allergic reactions like skin rash, itching or hives, swelling of the face, lips, or tongue -breathing problems -hallucinations -signs and symptoms of liver injury like dark yellow or Cisar urine; general ill feeling or flu-like symptoms; light-colored stools; loss of appetite; nausea; right upper quadrant belly pain; unusually weak or tired; yellowing of the eyes or skin -signs and symptoms of low blood pressure like dizziness; feeling faint or lightheaded, falls; unusually weak or tired -unusually slow heartbeat -unusually weak or tired Side effects that usually do not require medical attention (report to your doctor or health care professional if they continue or are bothersome): -blurred vision -constipation -dizziness -dry mouth -tiredness This list may not describe all possible side effects. Call your doctor for medical advice about side effects. You may report side effects to FDA at 1-800-FDA-1088. Where should I keep my medicine? Keep out of the reach of children. Store at room temperature between 15 and 30 degrees C (59 and 86 degrees F). Throw away any unused medicine after the expiration date. NOTE: This sheet is a summary.  It may not cover all possible information. If you have questions about this medicine, talk to your doctor, pharmacist, or health care provider.  2018 Elsevier/Gold Standard (2015-06-30 13:52:12)

## 2017-11-13 NOTE — Progress Notes (Signed)
[facility]    Provider:  Dr Jaynee Eagles Referring Provider: Claretta Fraise, MD Primary Care Physician:  Claretta Fraise, MD  CC:  Numbness right side arm and leg  HPI:  Rebecca Delacruz is a 32 y.o. female here as a referral from Dr. Livia Snellen for numbness right side arm and leg.  Past medical history chronic bilateral low back pain with bilateral sciatica, degenerative lumbar disc, cervical pain, asthma. She is here alone. Her right side goes numb and she has a bunch of trouble on her right knee. She has numbness from her shoulder to the tips of the fingers, the whole arm is numb, the right leg is numb from the right hip all the way down to the right foot. She reports it is the whole right arm and right leg, back and front, entirely numb. Worse with sitting in one position too long. Left side is not affected. Face is not affected, no new vision changes, She has had headaches recently in the evening feels like a sinus headache with tension no migrainous features. Symptoms started Last august 2018, no inciting event. She has tingling in the fingers and right hand. The arm can be numb and not associated with the leg numbness.   Reviewed notes, labs and imaging from outside physicians, which showed:  Reviewed report MRI thoracic spine with without contrast which was unremarkable, previously seen abnormality within the left T10 cord was artifactual possibly as a result of volume averaging without epidural fat.  Reviewed report MRI cervical spine straightening of the normal cervical lordosis which can be seen with muscle spasm or pain otherwise unremarkable.  Reviewed report MRI lumbar spine which showed minimal L4-L5 bulging disc with mild right facet arthrosis without spinal canal stenosis or neuroforaminal narrowing, unchanged.  HIV negative, RPR neg  Reviewed referring physician's notes.  Patient was being followed for their back pain bilateral leg pain and numbness, right greater  than the left and neck pain.  Symptoms reported include pain, pain at night, numbness, leg pain and pain with laying down.  She is doing poorly per report.  No medications used.  Conservative measures and cortisone injections have been used.  Cervical and lumbar MRIs and thoracic MRI was completed.  Cervical MRI shows decreased cervical lordosis, no neural compressive lesion noted, lumbar MRI shows a very minimal disc bulge at L4-L5, incidental finding of abnormality at T10 level was artifactual.  Patient says she has a difficult time even standing up just to do dishes.  Prednisone did not help.  No bowel or bladder incontinence.  The cervical and lumbar MRI did not show an obvious neurocompressive lesion.  No good explanation for the numbness, referred to me for neurologic evaluation.  Review of Systems: Patient complains of symptoms per HPI as well as the following symptoms: Memory loss, confusion, headaches, numbness, anxiety, decreased energy, weight gain, palpitations, swelling in legs.  She has had tubal ligation.. Pertinent negatives and positives per HPI. All others negative.   Social History   Socioeconomic History  . Marital status: Single    Spouse name: Not on file  . Number of children: Not on file  . Years of education: Not on file  . Highest education level: Not on file  Social Needs  . Financial resource strain: Not on file  . Food insecurity - worry: Not on file  . Food insecurity - inability: Not on file  . Transportation needs - medical: Not on file  . Transportation needs - non-medical:  Not on file  Occupational History  . Not on file  Tobacco Use  . Smoking status: Current Every Day Smoker    Packs/day: 0.50    Years: 10.00    Pack years: 5.00    Types: Cigarettes  . Smokeless tobacco: Never Used  Substance and Sexual Activity  . Alcohol use: Yes    Alcohol/week: 0.0 oz    Comment: 3 weeks ago  . Drug use: No    Comment: 09/09/12  last use around first of nov    . Sexual activity: Yes    Birth control/protection: Surgical  Other Topics Concern  . Not on file  Social History Narrative   ** Merged History Encounter **        Family History  Problem Relation Age of Onset  . Diabetes Mother   . Diabetes Maternal Grandmother   . Anesthesia problems Neg Hx   . Hypotension Neg Hx   . Malignant hyperthermia Neg Hx   . Pseudochol deficiency Neg Hx     Past Medical History:  Diagnosis Date  . Asthma   . Back pain   . Cervical pain   . Chronic bilateral low back pain with sciatica   . Constipation   . Degenerative lumbar disc   . Gonorrhea   . No pertinent past medical history   . Ovarian cyst   . Scoliosis   . Shortness of breath   . Trichomonas     Past Surgical History:  Procedure Laterality Date  . NO PAST SURGERIES    . TUBAL LIGATION  10/15/2011   Procedure: POST PARTUM TUBAL LIGATION;  Surgeon: Jonnie Kind, MD;  Location: [facility];  Service: Gynecology;  Laterality: Bilateral;  Post partum tubal ligation bilateral with filshie clips    Current Outpatient Medications  Medication Sig Dispense Refill  . albuterol (PROVENTIL) (2.5 MG/3ML) 0.083% nebulizer solution USE 1 VIAL VIA NEBULIZER EVERY 6 HOURS AS NEEDED FOR WHEEZING AND SHORTNESS OF BREATH 150 mL 4  . cetirizine (ZYRTEC) 10 MG tablet Take 1 tablet (10 mg total) by mouth daily. 30 tablet 11  . PROAIR HFA 108 (90 Base) MCG/ACT inhaler INHALE 2 PUFFS INTO THE LUNGS EVERY 6 (SIX) HOURS AS NEEDED FOR WHEEZING. 8.5 Inhaler 0  . SYMBICORT 160-4.5 MCG/ACT inhaler INHALE 2 PUFFS INTO THE LUNGS 2 (TWO) TIMES DAILY. 10.2 Inhaler 2  . tiZANidine (ZANAFLEX) 4 MG capsule Take 1 capsule (4 mg total) by mouth 3 (three) times daily as needed for muscle spasms. May also take for headache 60 capsule 2   Current Facility-Administered Medications  Medication Dose Route Frequency Provider Last Rate Last Dose  . betamethasone acetate-betamethasone sodium phosphate (CELESTONE) injection 6 mg   6 mg Intramuscular Once Claretta Fraise, MD        Allergies as of 11/13/2017 - Review Complete 11/13/2017  Allergen Reaction Noted  . Codeine Hives and Nausea Only 11/13/2017  . Aspirin Nausea Only and Palpitations 08/26/2013    Vitals: BP 102/75   Pulse 84   Ht 5\' 2"  (1.575 m)   Wt 240 lb 12.8 oz (109.2 kg)   BMI 44.04 kg/m  Last Weight:  Wt Readings from Last 1 Encounters:  11/13/17 240 lb 12.8 oz (109.2 kg)   Last Height:   Ht Readings from Last 1 Encounters:  11/13/17 5\' 2"  (1.575 m)    Physical exam: Exam: Gen: NAD, conversant, well nourised, obese, well groomed  CV: RRR, no MRG. No Carotid Bruits. No peripheral edema, warm, nontender Eyes: Conjunctivae clear without exudates or hemorrhage  Neuro: Detailed Neurologic Exam  Speech:    Speech is normal; fluent and spontaneous with normal comprehension.  Cognition:    The patient is oriented to person, place, and time;     recent and remote memory intact;     language fluent;     normal attention, concentration,     fund of knowledge Cranial Nerves:    The pupils are equal, round, and reactive to light. The fundi are normal and spontaneous venous pulsations are present. Visual fields are full to finger confrontation. Extraocular movements are intact. Trigeminal sensation is intact and the muscles of mastication are normal. The face is symmetric. The palate elevates in the midline. Hearing intact. Voice is normal. Shoulder shrug is normal. The tongue has normal motion without fasciculations.   Coordination:    Normal finger to nose and heel to shin. Normal rapid alternating movements.   Gait:    Heel-toe normal, mildly antalgic  Motor Observation:    No asymmetry, no atrophy, and no involuntary movements noted. Tone:    Normal muscle tone.    Posture:    Posture is normal. normal erect    Strength: right mild triceps and biceps and grip weakness. Right lower extremity diffusce mild  weakness 5-/5.  Otherwise strength is V/V in the upper and lower limbs.      Sensation: intact to LT     Reflex Exam:  DTR's:    Deep tendon reflexes in the upper and lower extremities are brisk bilaterally.   Toes:    The toes are downgoing bilaterally.   Clonus:    Clonus is absent.  Assessment/Plan:  32 year old with right arm and right leg numbness in no dermatomal pattern, MRIs have been unremarkable. Need MRi of the brain to evaluate for lesions or MS or any other intracranial etiologies. Also EMG/NCS of the right arm and right leg to eval for CTS or radiculopathy. Encouraged  PT for arm/leg numbness and weakness but she declines. Encouraged weight loss. Will provide Tizanidine for back spasms and tension headaches.   Orders Placed This Encounter  Procedures  . MR BRAIN W WO CONTRAST  . NCV with EMG(electromyography)    Discussed: To prevent or relieve headaches, try the following: Cool Compress. Lie down and place a cool compress on your head.  Avoid headache triggers. If certain foods or odors seem to have triggered your migraines in the past, avoid them. A headache diary might help you identify triggers.  Include physical activity in your daily routine. Try a daily walk or other moderate aerobic exercise.  Manage stress. Find healthy ways to cope with the stressors, such as delegating tasks on your to-do list.  Practice relaxation techniques. Try deep breathing, yoga, massage and visualization.  Eat regularly. Eating regularly scheduled meals and maintaining a healthy diet might help prevent headaches. Also, drink plenty of fluids.  Follow a regular sleep schedule. Sleep deprivation might contribute to headaches Consider biofeedback. With this mind-body technique, you learn to control certain bodily functions - such as muscle tension, heart rate and blood pressure - to prevent headaches or reduce headache pain.    Proceed to emergency room if you experience new or worsening  symptoms or symptoms do not resolve, if you have new neurologic symptoms or if headache is severe, or for any concerning symptom.   Provided education and documentation from [employer]  headache Society toolbox including articles on: chronic migraine medication overuse headache, chronic migraines, prevention of migraines, behavioral and other nonpharmacologic treatments for headache.  Cc: Dr. Livia Snellen and Dr. Campbell Riches, [facility] [address] [place],  [address]  Phone [phone] Fax [phone]

## 2017-11-16 ENCOUNTER — Telehealth: Payer: Self-pay | Admitting: *Deleted

## 2017-11-16 NOTE — Telephone Encounter (Signed)
Faxed Tizanidine PA with office note to [facility]. Received a receipt of confirmation.

## 2017-11-17 NOTE — Telephone Encounter (Signed)
Pt is requesting she be kept up to date when PA is approved

## 2017-11-23 ENCOUNTER — Ambulatory Visit: Payer: Medicaid Other | Admitting: Neurology

## 2017-11-23 ENCOUNTER — Ambulatory Visit (INDEPENDENT_AMBULATORY_CARE_PROVIDER_SITE_OTHER): Payer: Medicaid Other | Admitting: Neurology

## 2017-11-23 DIAGNOSIS — R202 Paresthesia of skin: Secondary | ICD-10-CM

## 2017-11-23 DIAGNOSIS — M6281 Muscle weakness (generalized): Secondary | ICD-10-CM

## 2017-11-23 DIAGNOSIS — R200 Anesthesia of skin: Secondary | ICD-10-CM

## 2017-11-23 DIAGNOSIS — R29818 Other symptoms and signs involving the nervous system: Secondary | ICD-10-CM

## 2017-11-23 DIAGNOSIS — Z0289 Encounter for other administrative examinations: Secondary | ICD-10-CM

## 2017-11-23 DIAGNOSIS — R29898 Other symptoms and signs involving the musculoskeletal system: Secondary | ICD-10-CM

## 2017-11-23 MED ORDER — TIZANIDINE HCL 4 MG PO TABS: 4.0000 mg | ORAL_TABLET | Freq: Three times a day (TID) | ORAL | 3 refills | Status: DC | PRN

## 2017-11-23 NOTE — Addendum Note (Signed)
Addended by: Belinda Block A on: 11/23/2017 04:03 PM   Modules accepted: Orders

## 2017-11-23 NOTE — Telephone Encounter (Signed)
Insurance prefers and will approve tablets and not capsules. New Rx sent to pharmacy and LM for patient making her aware.

## 2017-11-23 NOTE — Progress Notes (Signed)
See procedure note.

## 2017-11-23 NOTE — Procedures (Signed)
Full Name: Rebecca Delacruz Gender: Female MRN #: [id number] Date of Birth: [birth year]    Visit Date: 11/23/17 09:59 Age: 32 Years 2 Months Old Examining Physician: Sarina Ill, MD  Referring Physician: Claretta Fraise, MD    History: 32 year old with right arm and right leg numbness in no dermatomal pattern, MRIs have been unremarkable. Need MRi of the brain to evaluate for lesions or MS or any other intracranial etiologies. Also EMG/NCS of the right arm and right leg to eval for CTS or radiculopathy. Encouraged  PT for arm/leg numbness and weakness but she declines.  Summary: EMG/NCS was performed on the right leg and right arm. All nerves and muscles were normal. Patient declined EMG needle study on the right leg.  Conclusion: Normal study.    Sarina Ill M.D.  [facility] [place], [place] [address] Tel: [phone] Fax: [phone]        [facility]    Nerve / Sites Muscle Latency Ref. Amplitude Ref. Rel Amp Segments Distance Velocity Ref. Area    ms ms mV mV %  cm m/s m/s mVms  R Median - APB     Wrist APB 2.6 ?4.4 10.5 ?4.0 100 Wrist - APB 7   26.5     Upper arm APB 6.0  10.3  98.6 Upper arm - Wrist 20 59 ?49 25.7  R Ulnar - ADM     Wrist ADM 2.1 ?3.3 14.0 ?6.0 100 Wrist - ADM 7   36.2     B.Elbow ADM 4.9  13.4  95.6 B.Elbow - Wrist 17 60 ?49 35.3     A.Elbow ADM 6.5  13.6  101 A.Elbow - B.Elbow 10 62 ?49 34.9         A.Elbow - Wrist      R Peroneal - EDB     Ankle EDB 3.6 ?6.5 12.7 ?2.0 100 Ankle - EDB 9   32.1     Fib head EDB 9.4  11.9  93.4 Fib head - Ankle 30 52 ?44 31.2     Pop fossa EDB 11.3  11.2  94.6 Pop fossa - Fib head 10 53 ?44 32.9         Pop fossa - Ankle      R Tibial - AH     Ankle AH 4.1 ?5.8 12.5 ?4.0 100 Ankle - AH 9   22.7     Pop fossa AH 12.0  9.3  74.3 Pop fossa - Ankle 35 44 ?41 16.6             SNC    Nerve / Sites Rec. Site Peak Lat Ref.  Amp Ref. Segments Distance Peak Diff Ref.    ms ms V V  cm ms ms    R Radial - Anatomical snuff box (Forearm)     Forearm Wrist 2.3 ?2.9 26 ?15 Forearm - Wrist 10    R Sural - Ankle (Calf)     Calf Ankle 3.4 ?4.4 14 ?6 Calf - Ankle 14    R Superficial peroneal - Ankle     Lat leg Ankle 3.8 ?4.4 21 ?6 Lat leg - Ankle 14    R Median, Ulnar - Transcarpal comparison     Median Palm Wrist 1.9 ?2.2 89 ?35 Median Palm - Wrist 8       Ulnar Palm Wrist 1.8 ?2.2 26 ?12 Ulnar Palm - Wrist 8  Median Palm - Ulnar Palm  0.1 ?0.4  R Median - Orthodromic (Dig II, Mid palm)     Dig II Wrist 2.6 ?3.4 23 ?10 Dig II - Wrist 13    R Ulnar - Orthodromic, (Dig V, Mid palm)     Dig V Wrist 2.2 ?3.1 12 ?5 Dig V - Wrist 66                   F  Wave    Nerve F Lat Ref.   ms ms  R Tibial - AH 43.7 ?56.0  R Ulnar - ADM 22.3 ?32.0         EMG full       EMG Summary Table    Spontaneous MUAP Recruitment  Muscle IA Fib PSW Fasc Other Amp Dur. Poly Pattern  R. Deltoid Normal None None None _______ Normal Normal Normal Normal  R. Triceps brachii Normal None None None _______ Normal Normal Normal Normal  R. Pronator teres Normal None None None _______ Normal Normal Normal Normal  R. First dorsal interosseous Normal None None None _______ Normal Normal Normal Normal  R. Biceps brachii Normal None None None _______ Normal Normal Normal Normal

## 2017-11-23 NOTE — Progress Notes (Signed)
Full Name: Rebecca Delacruz Gender: Female MRN #: [id number] Date of Birth: 06-Jul-1986    Visit Date: 11/23/17 09:59 Age: 32 Years 2 Months Old Examining Physician: Sarina Ill, MD  Referring Physician: Claretta Fraise, MD    History: 32 year old with right arm and right leg numbness in no dermatomal pattern, MRIs have been unremarkable. Need MRi of the brain to evaluate for lesions or MS or any other intracranial etiologies. Also EMG/NCS of the right arm and right leg to eval for CTS or radiculopathy. Encouraged  PT for arm/leg numbness and weakness but she declines.  Summary: EMG/NCS was performed on the right leg and right arm. All nerves and muscles were normal. Patient declined EMG needle study on the right leg.  Conclusion: Normal study.    Sarina Ill M.D.  [facility] [place], [place] [address] Tel: [phone] Fax: [phone]        [facility]    Nerve / Sites Muscle Latency Ref. Amplitude Ref. Rel Amp Segments Distance Velocity Ref. Area    ms ms mV mV %  cm m/s m/s mVms  R Median - APB     Wrist APB 2.6 ?4.4 10.5 ?4.0 100 Wrist - APB 7   26.5     Upper arm APB 6.0  10.3  98.6 Upper arm - Wrist 20 59 ?49 25.7  R Ulnar - ADM     Wrist ADM 2.1 ?3.3 14.0 ?6.0 100 Wrist - ADM 7   36.2     B.Elbow ADM 4.9  13.4  95.6 B.Elbow - Wrist 17 60 ?49 35.3     A.Elbow ADM 6.5  13.6  101 A.Elbow - B.Elbow 10 62 ?49 34.9         A.Elbow - Wrist      R Peroneal - EDB     Ankle EDB 3.6 ?6.5 12.7 ?2.0 100 Ankle - EDB 9   32.1     Fib head EDB 9.4  11.9  93.4 Fib head - Ankle 30 52 ?44 31.2     Pop fossa EDB 11.3  11.2  94.6 Pop fossa - Fib head 10 53 ?44 32.9         Pop fossa - Ankle      R Tibial - AH     Ankle AH 4.1 ?5.8 12.5 ?4.0 100 Ankle - AH 9   22.7     Pop fossa AH 12.0  9.3  74.3 Pop fossa - Ankle 35 44 ?41 16.6             SNC    Nerve / Sites Rec. Site Peak Lat Ref.  Amp Ref. Segments Distance Peak Diff Ref.    ms ms V V  cm ms ms    R Radial - Anatomical snuff box (Forearm)     Forearm Wrist 2.3 ?2.9 26 ?15 Forearm - Wrist 10    R Sural - Ankle (Calf)     Calf Ankle 3.4 ?4.4 14 ?6 Calf - Ankle 14    R Superficial peroneal - Ankle     Lat leg Ankle 3.8 ?4.4 21 ?6 Lat leg - Ankle 14    R Median, Ulnar - Transcarpal comparison     Median Palm Wrist 1.9 ?2.2 89 ?35 Median Palm - Wrist 8       Ulnar Palm Wrist 1.8 ?2.2 26 ?12 Ulnar Palm - Wrist 8  Median Palm - Ulnar Palm  0.1 ?0.4  R Median - Orthodromic (Dig II, Mid palm)     Dig II Wrist 2.6 ?3.4 23 ?10 Dig II - Wrist 13    R Ulnar - Orthodromic, (Dig V, Mid palm)     Dig V Wrist 2.2 ?3.1 12 ?5 Dig V - Wrist 52                   F  Wave    Nerve F Lat Ref.   ms ms  R Tibial - AH 43.7 ?56.0  R Ulnar - ADM 22.3 ?32.0         EMG full       EMG Summary Table    Spontaneous MUAP Recruitment  Muscle IA Fib PSW Fasc Other Amp Dur. Poly Pattern  R. Deltoid Normal None None None _______ Normal Normal Normal Normal  R. Triceps brachii Normal None None None _______ Normal Normal Normal Normal  R. Pronator teres Normal None None None _______ Normal Normal Normal Normal  R. First dorsal interosseous Normal None None None _______ Normal Normal Normal Normal  R. Biceps brachii Normal None None None _______ Normal Normal Normal Normal

## 2017-11-28 ENCOUNTER — Ambulatory Visit
Admission: RE | Admit: 2017-11-28 | Discharge: 2017-11-28 | Disposition: A | Discharge status: Home / Self Care | Payer: Medicaid Other | Source: Ambulatory Visit | Attending: Neurology | Admitting: Neurology

## 2017-11-28 DIAGNOSIS — M6281 Muscle weakness (generalized): Secondary | ICD-10-CM

## 2017-11-28 DIAGNOSIS — R200 Anesthesia of skin: Secondary | ICD-10-CM

## 2017-11-28 DIAGNOSIS — R29818 Other symptoms and signs involving the nervous system: Secondary | ICD-10-CM

## 2017-11-28 DIAGNOSIS — R29898 Other symptoms and signs involving the musculoskeletal system: Secondary | ICD-10-CM

## 2017-11-28 DIAGNOSIS — R202 Paresthesia of skin: Secondary | ICD-10-CM

## 2017-11-28 MED ORDER — GADOBENATE DIMEGLUMINE 529 MG/ML IV SOLN
20.0000 mL | Freq: Once | INTRAVENOUS | Status: AC | PRN
  Administered 2017-11-28: 20 mL via INTRAVENOUS

## 2017-11-29 ENCOUNTER — Telehealth: Payer: Self-pay | Admitting: *Deleted

## 2017-11-29 NOTE — Telephone Encounter (Signed)
Spoke with patient and informed her that her MRI brain is unremarkable. She had no quesitons, verbalized understanding, appreciation.

## 2018-03-12 ENCOUNTER — Telehealth: Payer: Self-pay | Admitting: Orthopaedic Surgery

## 2018-03-15 ENCOUNTER — Ambulatory Visit: Payer: Medicaid Other | Admitting: Orthopaedic Surgery

## 2018-03-20 ENCOUNTER — Ambulatory Visit (INDEPENDENT_AMBULATORY_CARE_PROVIDER_SITE_OTHER): Payer: Medicaid Other | Admitting: Internal Medicine

## 2018-03-26 ENCOUNTER — Encounter (INDEPENDENT_AMBULATORY_CARE_PROVIDER_SITE_OTHER): Payer: Self-pay

## 2018-03-26 ENCOUNTER — Encounter (INDEPENDENT_AMBULATORY_CARE_PROVIDER_SITE_OTHER): Payer: Self-pay | Admitting: Internal Medicine

## 2018-03-26 ENCOUNTER — Ambulatory Visit (INDEPENDENT_AMBULATORY_CARE_PROVIDER_SITE_OTHER): Payer: Medicaid Other | Admitting: Internal Medicine

## 2018-04-04 ENCOUNTER — Ambulatory Visit: Payer: Medicaid Other | Admitting: Orthopaedic Surgery

## 2018-04-04 ENCOUNTER — Telehealth: Payer: Self-pay | Admitting: Orthopaedic Surgery

## 2018-04-04 NOTE — Telephone Encounter (Signed)
Pt's PCP has not been corrected as of 04/03/18.  I tried to call the patient x 2 but got no response from either call.  She was a no show for today's appt., 04/04/18.

## 2018-04-18 ENCOUNTER — Encounter (INDEPENDENT_AMBULATORY_CARE_PROVIDER_SITE_OTHER): Payer: Self-pay | Admitting: Internal Medicine

## 2018-04-18 ENCOUNTER — Ambulatory Visit (INDEPENDENT_AMBULATORY_CARE_PROVIDER_SITE_OTHER): Payer: Medicaid Other | Admitting: Internal Medicine

## 2018-04-18 ENCOUNTER — Encounter (INDEPENDENT_AMBULATORY_CARE_PROVIDER_SITE_OTHER): Payer: Self-pay | Admitting: *Deleted

## 2018-04-18 VITALS — BP 140/80 | HR 76 | Temp 97.6°F | Ht 62.0 in | Wt 206.7 lb

## 2018-04-18 DIAGNOSIS — K529 Noninfective gastroenteritis and colitis, unspecified: Secondary | ICD-10-CM

## 2018-04-18 DIAGNOSIS — R1310 Dysphagia, unspecified: Secondary | ICD-10-CM

## 2018-04-18 DIAGNOSIS — R1319 Other dysphagia: Secondary | ICD-10-CM

## 2018-04-18 LAB — CBC WITH DIFFERENTIAL/PLATELET
Basophils Absolute: 7 cells/uL (ref 0–200)
Basophils Relative: 0.1 %
EOS PCT: 3.2 %
Eosinophils Absolute: 234 cells/uL (ref 15–500)
HCT: 37.6 % (ref 35.0–45.0)
Hemoglobin: 12.5 g/dL (ref 11.7–15.5)
Lymphs Abs: 1840 cells/uL (ref 850–3900)
MCH: 26.2 pg — ABNORMAL LOW (ref 27.0–33.0)
MCHC: 33.2 g/dL (ref 32.0–36.0)
MCV: 78.7 fL — ABNORMAL LOW (ref 80.0–100.0)
MPV: 11.5 fL (ref 7.5–12.5)
Monocytes Relative: 5.9 %
NEUTROS PCT: 65.6 %
Neutro Abs: 4789 cells/uL (ref 1500–7800)
PLATELETS: 265 10*3/uL (ref 140–400)
RBC: 4.78 10*6/uL (ref 3.80–5.10)
RDW: 14.1 % (ref 11.0–15.0)
TOTAL LYMPHOCYTE: 25.2 %
WBC mixed population: 431 cells/uL (ref 200–950)
WBC: 7.3 10*3/uL (ref 3.8–10.8)

## 2018-04-18 LAB — SEDIMENTATION RATE: Sed Rate: 9 mm/h (ref 0–20)

## 2018-04-18 NOTE — Progress Notes (Signed)
   Subjective:    Patient ID: Rebecca Delacruz, female    DOB: 01-Dec-1985, 32 y.o.   MRN: [id number]  HPI Referred by Etter Sjogren FNP-C for possible UC. See in the ED in June at [facility]. She had vomiting. She had abdominal pain.  She says her K was down. She had a CT which revealed diffuse wall thickening of involving most of the colon with sparing of the rectosigmoid colon, consistent with colitis. Findings could be secondary to inflammatory bowel disease or infection.  She was started on Amoxicillin. She stopped the Amoxicillin after 2 days.  She was sick for approximately 8 days.  She says today she has some rectal pain when she has a BM. She says foods are lodging x 5-6 months. If she drinks fluid, it will sit in her esophagus. She has a BM x 3 a week. Stools are soft.  . She says before she went to the ED at [facility] her stools were tarry black. Had not been taking Pepto Bismol.     02/21/2018 H and H 13.2 and 40.8. Review of Systems Past Medical History:  Diagnosis Date  . Asthma   . Back pain   . Cervical pain   . Chronic bilateral low back pain with sciatica   . Constipation   . Degenerative lumbar disc   . Gonorrhea   . No pertinent past medical history   . Ovarian cyst   . Scoliosis   . Shortness of breath   . Trichomonas     Past Surgical History:  Procedure Laterality Date  . NO PAST SURGERIES    . TUBAL LIGATION  10/15/2011   Procedure: POST PARTUM TUBAL LIGATION;  Surgeon: Jonnie Kind, MD;  Location: [facility];  Service: Gynecology;  Laterality: Bilateral;  Post partum tubal ligation bilateral with filshie clips    Allergies  Allergen Reactions  . Codeine Hives and Nausea Only  . Aspirin Nausea Only and Palpitations    Current Outpatient Medications on File Prior to Visit  Medication Sig Dispense Refill  . cyclobenzaprine (FLEXERIL) 10 MG tablet Take 10 mg by mouth 2 (two) times daily after a meal.    . SYMBICORT 160-4.5 MCG/ACT inhaler INHALE 2 PUFFS INTO  THE LUNGS 2 (TWO) TIMES DAILY. 10.2 Inhaler 2   Current Facility-Administered Medications on File Prior to Visit  Medication Dose Route Frequency Provider Last Rate Last Dose  . betamethasone acetate-betamethasone sodium phosphate (CELESTONE) injection 6 mg  6 mg Intramuscular Once Claretta Fraise, MD            Objective:   Physical Exam Blood pressure 140/80, pulse 76, temperature 97.6 F (36.4 C), height 5\' 2"  (1.575 m), weight 206 lb 11.2 oz (93.8 kg). Alert and oriented. Skin warm and dry. Oral mucosa is moist.   . Sclera anicteric, conjunctivae is pink. Thyroid not enlarged. No cervical lymphadenopathy. Lungs clear. Heart regular rate and rhythm.  Abdomen is soft. Bowel sounds are positive. No hepatomegaly. No abdominal masses felt. No tenderness.  No edema to lower extremities.   No stool. No masses guaiac negative.       Assessment & Plan:  Colitis. Am going to get a follow up CT. sedrate and CBC. I want to be sure her colitis has cleared.  Dysphagia: am going to get an Esophagram.  Further recommendations to follow.

## 2018-04-24 ENCOUNTER — Ambulatory Visit (HOSPITAL_COMMUNITY): Payer: Medicaid Other

## 2018-04-27 ENCOUNTER — Ambulatory Visit (HOSPITAL_COMMUNITY)
Admission: RE | Admit: 2018-04-27 | Discharge: 2018-04-27 | Disposition: A | Discharge status: Home / Self Care | Payer: Medicaid Other | Source: Ambulatory Visit | Attending: Internal Medicine | Admitting: Internal Medicine

## 2018-04-27 DIAGNOSIS — R1310 Dysphagia, unspecified: Secondary | ICD-10-CM | POA: Insufficient documentation

## 2018-04-27 DIAGNOSIS — R1319 Other dysphagia: Secondary | ICD-10-CM

## 2018-05-08 ENCOUNTER — Ambulatory Visit (HOSPITAL_COMMUNITY): Payer: Medicaid Other

## 2018-05-14 ENCOUNTER — Ambulatory Visit (HOSPITAL_COMMUNITY): Admission: RE | Admit: 2018-05-14 | Payer: Medicaid Other | Source: Ambulatory Visit

## 2018-05-15 ENCOUNTER — Ambulatory Visit: Payer: Medicaid Other | Admitting: Orthopaedic Surgery

## 2018-05-16 ENCOUNTER — Encounter: Payer: Self-pay | Admitting: Orthopaedic Surgery

## 2018-05-16 ENCOUNTER — Ambulatory Visit (INDEPENDENT_AMBULATORY_CARE_PROVIDER_SITE_OTHER): Payer: Medicaid Other

## 2018-05-16 ENCOUNTER — Ambulatory Visit: Payer: Medicaid Other | Admitting: Orthopaedic Surgery

## 2018-05-16 VITALS — BP 127/75 | HR 100 | Ht 62.0 in | Wt 200.0 lb

## 2018-05-16 DIAGNOSIS — G8929 Other chronic pain: Secondary | ICD-10-CM | POA: Diagnosis not present

## 2018-05-16 DIAGNOSIS — M25562 Pain in left knee: Secondary | ICD-10-CM

## 2018-05-16 DIAGNOSIS — M25561 Pain in right knee: Secondary | ICD-10-CM | POA: Diagnosis not present

## 2018-05-16 DIAGNOSIS — S8991XA Unspecified injury of right lower leg, initial encounter: Secondary | ICD-10-CM

## 2018-05-16 NOTE — Patient Instructions (Signed)
You will be receiving a call from [place]. If you do not receive a call in the next week please call [phone]

## 2018-05-16 NOTE — Progress Notes (Signed)
Subjective:    Patient ID: Rebecca Delacruz, female    DOB: March 17, 1986, 32 y.o.   MRN: [id number]  HPI She fell and hurt her knees during a rainy day.  She slipped on the pavement about two and a half to three months ago.  Her right knee has been more painful but her left knee is hurting some too.  She saw her family doctor on 03-06-18 about this.  I have reviewed the notes.  She was given ibuprofen but the pain of the right knee continues and the knee swells and now gives way.  She has popping as well.  She has no redness.  She is tired of the knee on the right hurting and giving way.  She has no other trauma.   Review of Systems  Constitutional: Positive for activity change.  Respiratory: Positive for shortness of breath. Negative for cough.   Musculoskeletal: Positive for arthralgias, gait problem, joint swelling and neck pain.  All other systems reviewed and are negative.  For Review of Systems, all other systems reviewed and are negative.  Past Medical History:  Diagnosis Date  . Asthma   . Back pain   . Cervical pain   . Chronic bilateral low back pain with sciatica   . Constipation   . Degenerative lumbar disc   . Gonorrhea   . No pertinent past medical history   . Ovarian cyst   . Scoliosis   . Shortness of breath   . Trichomonas     Past Surgical History:  Procedure Laterality Date  . NO PAST SURGERIES    . TUBAL LIGATION  10/15/2011   Procedure: POST PARTUM TUBAL LIGATION;  Surgeon: Jonnie Kind, MD;  Location: [facility];  Service: Gynecology;  Laterality: Bilateral;  Post partum tubal ligation bilateral with filshie clips    Current Outpatient Medications on File Prior to Visit  Medication Sig Dispense Refill  . cyclobenzaprine (FLEXERIL) 10 MG tablet Take 10 mg by mouth 2 (two) times daily after a meal.    . SYMBICORT 160-4.5 MCG/ACT inhaler INHALE 2 PUFFS INTO THE LUNGS 2 (TWO) TIMES DAILY. 10.2 Inhaler 2   Current Facility-Administered Medications on File  Prior to Visit  Medication Dose Route Frequency Provider Last Rate Last Dose  . betamethasone acetate-betamethasone sodium phosphate (CELESTONE) injection 6 mg  6 mg Intramuscular Once Claretta Fraise, MD        Social History   Socioeconomic History  . Marital status: Single    Spouse name: Not on file  . Number of children: Not on file  . Years of education: Not on file  . Highest education level: Not on file  Occupational History  . Not on file  Social Needs  . Financial resource strain: Not on file  . Food insecurity:    Worry: Not on file    Inability: Not on file  . Transportation needs:    Medical: Not on file    Non-medical: Not on file  Tobacco Use  . Smoking status: Current Every Day Smoker    Packs/day: 0.50    Years: 10.00    Pack years: 5.00    Types: Cigarettes  . Smokeless tobacco: Never Used  Substance and Sexual Activity  . Alcohol use: Yes    Alcohol/week: 0.0 standard drinks    Comment: 3 weeks ago  . Drug use: No    Types: Marijuana    Comment: 09/09/12  last use around first of nov  .  Sexual activity: Yes    Birth control/protection: Surgical  Lifestyle  . Physical activity:    Days per week: Not on file    Minutes per session: Not on file  . Stress: Not on file  Relationships  . Social connections:    Talks on phone: Not on file    Gets together: Not on file    Attends religious service: Not on file    Active member of club or organization: Not on file    Attends meetings of clubs or organizations: Not on file    Relationship status: Not on file  . Intimate partner violence:    Fear of current or ex partner: Not on file    Emotionally abused: Not on file    Physically abused: Not on file    Forced sexual activity: Not on file  Other Topics Concern  . Not on file  Social History Narrative   ** Merged History Encounter **        Family History  Problem Relation Age of Onset  . Diabetes Mother   . Hypertension Mother   . Asthma  Mother   . Diabetes Maternal Grandmother   . Heart attack Maternal Grandmother   . Hypertension Brother   . Asthma Brother   . Heart attack Maternal Grandfather   . Anesthesia problems Neg Hx   . Hypotension Neg Hx   . Malignant hyperthermia Neg Hx   . Pseudochol deficiency Neg Hx     BP 127/75   Pulse 100   Ht 5\' 2"  (1.575 m)   Wt 200 lb (90.7 kg)   LMP 05/09/2018 (Exact Date)   BMI 36.58 kg/m   Body mass index is 36.58 kg/m.     Objective:   Physical Exam  Constitutional: She is oriented to person, place, and time. She appears well-developed and well-nourished.  HENT:  Head: Normocephalic and atraumatic.  Eyes: Pupils are equal, round, and reactive to light. Conjunctivae and EOM are normal.  Neck: Normal range of motion. Neck supple.  Cardiovascular: Normal rate, regular rhythm and intact distal pulses.  Pulmonary/Chest: Effort normal.  Abdominal: Soft.  Musculoskeletal:       Right knee: She exhibits decreased range of motion and swelling. Tenderness found. Medial joint line tenderness noted.       Left knee: Tenderness found. Medial joint line tenderness noted.       Legs: Neurological: She is alert and oriented to person, place, and time. She has normal reflexes. She displays normal reflexes. No cranial nerve deficit. She exhibits normal muscle tone. Coordination normal.  Skin: Skin is warm and dry.  Psychiatric: She has a normal mood and affect. Her behavior is normal. Judgment and thought content normal.     X-rays were done of both knees, reported separately.  I have reviewed the family doctor notes.     Assessment & Plan:   Encounter Diagnosis  Name Primary?  . Chronic pain of both knees Yes   I am concerned about a meniscus injury on the right knee.  I have scheduled a MRI of the knee.  I have shown her a model of the right knee and explained potential problems. She may need surgery on the knee.  She is to continue the ibuprofen.  Return after the  MRI.  Call if any problem.  Precautions discussed.   Electronically Signed Sanjuana Kava, MD 8/14/20199:24 AM

## 2018-05-22 ENCOUNTER — Other Ambulatory Visit: Payer: Medicaid Other

## 2018-05-31 ENCOUNTER — Ambulatory Visit: Payer: Medicaid Other | Admitting: Advanced Practice Midwife

## 2018-06-05 ENCOUNTER — Telehealth: Payer: Self-pay | Admitting: *Deleted

## 2018-06-05 NOTE — Telephone Encounter (Signed)
Calling to check on appt day/time.

## 2018-06-06 ENCOUNTER — Other Ambulatory Visit: Payer: Medicaid Other

## 2018-06-08 ENCOUNTER — Inpatient Hospital Stay: Admission: RE | Admit: 2018-06-08 | Payer: Medicaid Other | Source: Ambulatory Visit

## 2018-06-12 ENCOUNTER — Ambulatory Visit: Payer: Medicaid Other | Admitting: Advanced Practice Midwife

## 2018-10-11 ENCOUNTER — Other Ambulatory Visit: Payer: Medicaid Other | Admitting: Advanced Practice Midwife

## 2018-12-28 ENCOUNTER — Other Ambulatory Visit: Payer: Self-pay | Admitting: Family Medicine

## 2019-01-21 ENCOUNTER — Encounter: Payer: Medicaid Other | Admitting: Internal Medicine

## 2019-01-21 ENCOUNTER — Other Ambulatory Visit: Payer: Self-pay

## 2019-01-21 NOTE — Progress Notes (Deleted)
Virtual Visit via Video Note  I connected with Rebecca Delacruz on 01/21/19 at 10:30 AM EDT by a video enabled telemedicine application and verified that I am speaking with the correct person using two identifiers.   I discussed the limitations of evaluation and management by telemedicine and the availability of in person appointments. The patient expressed understanding and agreed to proceed.  -Location of the patient : -Location of the provider : Office  -The names of all persons participating in the telemedicine service : [place]      Name: Rebecca Delacruz  MRN/ DOB: [id number], 01-20-1986    Age/ Sex: 33 y.o., female    PCP: Ezra Sites, NP   Reason for Endocrinology Evaluation: Graves' Disease      Date of Initial Endocrinology Evaluation: 01/21/2019     HPI: [name] is a 33 y.o. female with a past medical history of Graves' Disease. The patient presented for initial endocrinology clinic visit on 01/21/2019 for consultative assistance with her Hyperthyroidism .   Pt was diagnosed with hyperthyroidism secondary to graves' disease in   HISTORY:  Past Medical History:  Past Medical History:  Diagnosis Date  . Asthma   . Back pain   . Cervical pain   . Chronic bilateral low back pain with sciatica   . Constipation   . Degenerative lumbar disc   . Gonorrhea   . No pertinent past medical history   . Ovarian cyst   . Scoliosis   . Shortness of breath   . Trichomonas     Past Surgical History:  Past Surgical History:  Procedure Laterality Date  . NO PAST SURGERIES    . TUBAL LIGATION  10/15/2011   Procedure: POST PARTUM TUBAL LIGATION;  Surgeon: Jonnie Kind, MD;  Location: [facility];  Service: Gynecology;  Laterality: Bilateral;  Post partum tubal ligation bilateral with filshie clips      Social History:  reports that she has been smoking cigarettes. She has a 5.00 pack-year smoking history. She has never used smokeless tobacco. She reports  current alcohol use. She reports that she does not use drugs.  Family History: family history includes Asthma in her brother and mother; Diabetes in her maternal grandmother and mother; Heart attack in her maternal grandfather and maternal grandmother; Hypertension in her brother and mother.   HOME MEDICATIONS: Allergies as of 01/21/2019      Reactions   Codeine Hives, Nausea Only   Aspirin Nausea Only, Palpitations      Medication List       Accurate as of January 21, 2019  8:33 AM. Always use your most recent med list.        cyclobenzaprine 10 MG tablet Commonly known as:  FLEXERIL Take 10 mg by mouth 2 (two) times daily after a meal.   Symbicort 160-4.5 MCG/ACT inhaler Generic drug:  budesonide-formoterol INHALE 2 PUFFS INTO THE LUNGS 2 (TWO) TIMES DAILY.         REVIEW OF SYSTEMS: A comprehensive ROS was conducted with the patient and is negative except as per HPI and below:  ROS      DATA REVIEWED: ***    ASSESSMENT/PLAN/RECOMMENDATIONS:   1. ***    Medications :    I discussed the assessment and treatment plan with the patient. The patient was provided an opportunity to ask questions and all were answered. The patient agreed with the plan and demonstrated an understanding of the instructions.  The patient was advised to call back or seek an in-person evaluation if the symptoms worsen or if the condition fails to improve as anticipated.  I provided *** minutes of non-face-to-face time during this encounter.   Signed electronically by: Mack Guise, MD  [facility]  [facility] [address], [place], [place] [address] Phone: [phone] FAX: [phone]   CC: Ezra Sites, NP [address] [place] [address] Phone: [phone] Fax: [phone]   Return to Endocrinology clinic as below: Future Appointments  Date Time Provider [place]  01/21/2019 10:30 AM Menaal Russum,  Melanie Crazier, MD [facility] None

## 2019-01-23 ENCOUNTER — Encounter: Payer: Medicaid Other | Admitting: Internal Medicine

## 2019-01-23 ENCOUNTER — Encounter: Payer: Self-pay | Admitting: Internal Medicine

## 2019-01-23 ENCOUNTER — Other Ambulatory Visit: Payer: Self-pay

## 2019-01-23 NOTE — Progress Notes (Deleted)
Virtual Visit via Video Note  I connected with Rebecca Delacruz on 01/23/19 at  1:00 PM EDT by a video enabled telemedicine application and verified that I am speaking with the correct person using two identifiers.   I discussed the limitations of evaluation and management by telemedicine and the availability of in person appointments. The patient expressed understanding and agreed to proceed.  -Location of the patient : -Location of the provider : Office  -The names of all persons participating in the telemedicine service :      Name: JOLINA SYMONDS  MRN/ DOB: [id number], [birth year]    Age/ Sex: 33 y.o., female    PCP: Ezra Sites, NP   Reason for Endocrinology Evaluation: Graves' Disease      Date of Initial Endocrinology Evaluation: 01/23/2019     HPI: Rebecca Delacruz is a 33 y.o. female with a past medical history of Graves' Disease. The patient presented for initial endocrinology clinic visit on 01/23/2019 for consultative assistance with her Hyperthyroidism .   Pt was diagnosed with hyperthyroidism secondary to graves' disease in   HISTORY:  Past Medical History:  Past Medical History:  Diagnosis Date  . Asthma   . Back pain   . Cervical pain   . Chronic bilateral low back pain with sciatica   . Constipation   . Degenerative lumbar disc   . Gonorrhea   . No pertinent past medical history   . Ovarian cyst   . Scoliosis   . Shortness of breath   . Trichomonas    Past Surgical History:  Past Surgical History:  Procedure Laterality Date  . NO PAST SURGERIES    . TUBAL LIGATION  10/15/2011   Procedure: POST PARTUM TUBAL LIGATION;  Surgeon: Jonnie Kind, MD;  Location: [facility];  Service: Gynecology;  Laterality: Bilateral;  Post partum tubal ligation bilateral with filshie clips      Social History:  reports that she has been smoking cigarettes. She has a 5.00 pack-year smoking history. She has never used smokeless tobacco. She reports current alcohol use.  She reports that she does not use drugs.  Family History: family history includes Asthma in her brother and mother; Diabetes in her maternal grandmother and mother; Heart attack in her maternal grandfather and maternal grandmother; Hypertension in her brother and mother.   HOME MEDICATIONS: Allergies as of 01/23/2019      Reactions   Codeine Hives, Nausea Only   Aspirin Nausea Only, Palpitations      Medication List       Accurate as of January 23, 2019 12:57 PM. Always use your most recent med list.        atenolol 50 MG tablet Commonly known as:  TENORMIN Take 50 mg by mouth daily.   cyclobenzaprine 10 MG tablet Commonly known as:  FLEXERIL Take 10 mg by mouth 2 (two) times daily after a meal.   ondansetron 4 MG tablet Commonly known as:  ZOFRAN Take 4 mg by mouth 3 (three) times daily as needed.   oxyCODONE-acetaminophen 5-325 MG tablet Commonly known as:  PERCOCET/ROXICET Take 1 tablet by mouth every 8 (eight) hours as needed.   pantoprazole 40 MG tablet Commonly known as:  PROTONIX Take 40 mg by mouth daily.   Symbicort 160-4.5 MCG/ACT inhaler Generic drug:  budesonide-formoterol INHALE 2 PUFFS INTO THE LUNGS 2 (TWO) TIMES DAILY.         REVIEW OF SYSTEMS: A comprehensive ROS was conducted with the  patient and is negative except as per HPI and below:  ROS      DATA REVIEWED: ***    ASSESSMENT/PLAN/RECOMMENDATIONS:   1. Hyperthyroidism     Medications :    I discussed the assessment and treatment plan with the patient. The patient was provided an opportunity to ask questions and all were answered. The patient agreed with the plan and demonstrated an understanding of the instructions.   The patient was advised to call back or seek an in-person evaluation if the symptoms worsen or if the condition fails to improve as anticipated.  I provided *** minutes of non-face-to-face time during this encounter.   Signed electronically by: Mack Guise, MD  [facility]inology  [facility] [address], [place], [place] [address] Phone: [phone] FAX: [phone]   CC: Ezra Sites, NP [address] [place] [address] Phone: [phone] Fax: [phone]   Return to Endocrinology clinic as below: Future Appointments  Date Time Provider [place]  01/23/2019  1:00 PM Omid Deardorff, Melanie Crazier, MD [facility] None

## 2019-02-04 ENCOUNTER — Other Ambulatory Visit: Payer: Self-pay

## 2019-02-04 ENCOUNTER — Ambulatory Visit (INDEPENDENT_AMBULATORY_CARE_PROVIDER_SITE_OTHER): Payer: Medicaid Other | Admitting: Internal Medicine

## 2019-02-04 ENCOUNTER — Encounter: Payer: Self-pay | Admitting: Internal Medicine

## 2019-02-04 DIAGNOSIS — E0500 Thyrotoxicosis with diffuse goiter without thyrotoxic crisis or storm: Secondary | ICD-10-CM | POA: Diagnosis not present

## 2019-02-04 DIAGNOSIS — E0590 Thyrotoxicosis, unspecified without thyrotoxic crisis or storm: Secondary | ICD-10-CM | POA: Insufficient documentation

## 2019-02-04 DIAGNOSIS — Z720 Tobacco use: Secondary | ICD-10-CM | POA: Diagnosis not present

## 2019-02-04 MED ORDER — METHIMAZOLE 10 MG PO TABS
20.0000 mg | ORAL_TABLET | Freq: Every day | ORAL | 3 refills | Status: DC
Start: 2019-02-04 — End: 2019-08-20

## 2019-02-04 NOTE — Progress Notes (Signed)
Virtual Visit via Video Note  I connected with Rebecca Delacruz  on 02/04/19 at  2:40 PM EDT by a video enabled telemedicine application and verified that I am speaking with the correct person using two identifiers.   I discussed the limitations of evaluation and management by telemedicine and the availability of in person appointments. The patient expressed understanding and agreed to proceed.  -Location of the patient : Home  -Location of the provider : office -The names of all persons participating in the telemedicine service : Pt and myself     Name: Rebecca Delacruz  MRN/ DOB: [id number], [birth year]    Age/ Sex: 33 y.o., female    PCP: Adaline Sill, NP   Reason for Endocrinology Evaluation: Hyperthyroidism      Date of Initial Endocrinology Evaluation: 02/04/2019     HPI: [name] is a 33 y.o. female with a past medical history of Asthma  And bipolar disorder. The patient presented for initial endocrinology clinic visit on 02/04/2019 for consultative assistance with her Hyperthyroidism.    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 She has not been on treatment . Per her records she was incarcerated which delayed her treatment.   Today she is c/o weight loss of ~ 100 lbs since November, 2019. She also has heat intolerance , diarrhea, palpitations, tremors, anxiety and jittery sensation . She also has noted eye bulging, she does have allergies but denies blurry vision or double vision.   She dis notice anterior neck enlargement but no pain.    Maternal aunt with hypothyroidism  Pt is a smoker.    HISTORY:  Past Medical History:  Past Medical History:  Diagnosis Date   Asthma    Back pain    Cervical pain    Chronic bilateral low back pain with sciatica    Constipation    Degenerative lumbar disc    Gonorrhea    No pertinent past medical history    Ovarian cyst    Scoliosis    Shortness of breath     Trichomonas    Past Surgical History:  Past Surgical History:  Procedure Laterality Date   NO PAST SURGERIES     TUBAL LIGATION  10/15/2011   Procedure: POST PARTUM TUBAL LIGATION;  Surgeon: Jonnie Kind, MD;  Location: [facility];  Service: Gynecology;  Laterality: Bilateral;  Post partum tubal ligation bilateral with filshie clips      Social History:  reports that she has been smoking cigarettes. She has a 5.00 pack-year smoking history. She has never used smokeless tobacco. She reports current alcohol use. She reports that she does not use drugs.  Family History: family history includes Asthma in her brother and mother; Diabetes in her maternal grandmother and mother; Heart attack in her maternal grandfather and maternal grandmother; Hypertension in her brother and mother.   HOME MEDICATIONS: Allergies as of 02/04/2019      Reactions   Codeine Hives, Nausea Only   Aspirin Nausea Only, Palpitations      Medication List       Accurate as of Feb 04, 2019  4:11 PM. Always use your most recent med list.        atenolol 50 MG tablet Commonly known as:  TENORMIN Take 50 mg by mouth daily.   cyclobenzaprine 10 MG tablet Commonly known as:  FLEXERIL Take 10 mg by mouth 2 (two) times daily after a meal.   methimazole 10  MG tablet Commonly known as:  TAPAZOLE Take 2 tablets (20 mg total) by mouth daily.   ondansetron 4 MG tablet Commonly known as:  ZOFRAN Take 4 mg by mouth 3 (three) times daily as needed.   oxyCODONE-acetaminophen 5-325 MG tablet Commonly known as:  PERCOCET/ROXICET Take 1 tablet by mouth every 8 (eight) hours as needed.   pantoprazole 40 MG tablet Commonly known as:  PROTONIX Take 40 mg by mouth daily.   Symbicort 160-4.5 MCG/ACT inhaler Generic drug:  budesonide-formoterol INHALE 2 PUFFS INTO THE LUNGS 2 (TWO) TIMES DAILY.         REVIEW OF SYSTEMS: A comprehensive ROS was conducted with the patient and is negative except as per HPI and below:    Review of Systems  Constitutional: Positive for weight loss.  HENT: Negative for congestion and sore throat.   Eyes: Negative for blurred vision and double vision.  Respiratory: Negative for cough and shortness of breath.   Cardiovascular: Negative for chest pain and palpitations.  Gastrointestinal: Positive for diarrhea and nausea.  Genitourinary: Negative for frequency.  Neurological: Positive for tremors. Negative for tingling.  Endo/Heme/Allergies: Negative for polydipsia.  Psychiatric/Behavioral: Negative for depression. The patient is nervous/anxious.       PHYSICAL EXAM:  General: Pt appears well smoking a cigarette  HEENT: Head: Unremarkable  Eyes: External eye exam abnormal with a stare,  And exophthalmos.  EOM intact.    Neck:  Thyroid: Thyroid size enlarged ~ 80 grams by inspection    DATA REVIEWED: 11/2018  TSH 0.006 uIU/mL FT4 5.2    ASSESSMENT/PLAN/RECOMMENDATIONS:   1. Hyperthyroidism secondary to Graves' Disease:  - Clinically and biochemically euthyroid - We discussed that Graves' Disease is a result of an autoimmune condition involving the thyroid.  - We discussed with pt the benefits of methimazole in the Tx of hyperthyroidism, as well as the possible side effects/complications of anti-thyroid drug Tx (specifically detailing the rare, but serious side effect of agranulocytosis). She was informed of need for regular thyroid function monitoring while on methimazole to ensure appropriate dosage without over-treatment. As well, we discussed the possible side effects of methimazole including the chance of rash, the small chance of liver irritation/juandice and the <=1 in 300-400 chance of sudden onset agranulocytosis.  We discussed importance of going to ED promptly (and stopping methimazole) if shewere to develop significant fever with severe sore throat of other evidence of acute infection.     We extensively discussed the various treatment options for  hyperthyroidism and Graves disease including ablation therapy with radioactive iodine versus antithyroid drug treatment versus surgical therapy.  We recommended to the patient that we felt, at this time, that thionamide therapy would be most optimal.  We discussed the various possible benefits versus side effects of the various therapies.     Medications : Methimazole 20 mg daily  Labs in 4 weeks - pt will stop by [facility]  2. Graves' Disease:  - Pt with exophthalmos, she does have symptoms that she attributes to her allergies. I have urged her to see an ophthalmologist. I am going to try and refer her but I believe with medicaid, this referral has to be initiated through her PCP.  - I explained to her the pathophysiology of graves' orbitopathy. We also discussed that controlling her TFT's may not necessarily her graves' orbitopathy.    3. Tobacco Abuse :    - Counseled about tobacco cessation , we discussed her reduced chance of going in to remission with  continued tobacco abuse, we also discussed increased risk of graves' orbitopathy with continued smoking status    I discussed the assessment and treatment plan with the patient. The patient was provided an opportunity to ask questions and all were answered. The patient agreed with the plan and demonstrated an understanding of the instructions.   The patient was advised to call back or seek an in-person evaluation if the symptoms worsen or if the condition fails to improve as anticipated.  F/u in 2 months  Signed electronically by: Mack Guise, MD  [facility]  [address] [place], [place] [address] Phone: [phone] FAX: [phone]   CC: Adaline Sill, NP [address] [place] [address] Phone: [phone] Fax: [phone]   Return to Endocrinology clinic as below: No future appointments.

## 2019-02-18 ENCOUNTER — Telehealth: Payer: Self-pay | Admitting: Internal Medicine

## 2019-02-18 NOTE — Telephone Encounter (Signed)
Per [facility], "Caller requesting a return call about her medication."

## 2019-02-19 NOTE — Telephone Encounter (Signed)
Returned pt call and lft vm to call back to discuss medications

## 2019-03-15 ENCOUNTER — Encounter: Payer: Self-pay | Admitting: *Deleted

## 2019-03-18 ENCOUNTER — Ambulatory Visit (INDEPENDENT_AMBULATORY_CARE_PROVIDER_SITE_OTHER): Payer: Medicaid Other | Admitting: Advanced Practice Midwife

## 2019-03-18 ENCOUNTER — Other Ambulatory Visit: Payer: Self-pay

## 2019-03-18 ENCOUNTER — Encounter: Payer: Self-pay | Admitting: Advanced Practice Midwife

## 2019-03-18 VITALS — BP 110/61 | HR 81 | Ht 63.0 in | Wt 141.0 lb

## 2019-03-18 DIAGNOSIS — N941 Unspecified dyspareunia: Secondary | ICD-10-CM | POA: Diagnosis not present

## 2019-03-18 DIAGNOSIS — Z3202 Encounter for pregnancy test, result negative: Secondary | ICD-10-CM | POA: Diagnosis not present

## 2019-03-18 DIAGNOSIS — R319 Hematuria, unspecified: Secondary | ICD-10-CM | POA: Diagnosis not present

## 2019-03-18 LAB — POCT URINALYSIS DIPSTICK OB
Blood, UA: 3
Clarity, UA: NEGATIVE
Glucose, UA: NEGATIVE
Ketones, UA: NEGATIVE
Leukocytes, UA: NEGATIVE
Nitrite, UA: NEGATIVE
POC,PROTEIN,UA: NEGATIVE

## 2019-03-18 LAB — POCT URINE PREGNANCY: Preg Test, Ur: NEGATIVE

## 2019-03-18 MED ORDER — DOXYCYCLINE HYCLATE 100 MG PO CAPS: 100.0000 mg | ORAL_CAPSULE | Freq: Two times a day (BID) | ORAL | 0 refills | Status: DC

## 2019-03-18 NOTE — Progress Notes (Signed)
[facility] Visit  Patient name: Rebecca Delacruz MRN [id number]  Date of birth: 1986-07-02  CC & HPI:  Rebecca Delacruz is a 33 y.o. African American female presenting today for lower pelvic pain/dyspareunia for 3-4 months.  Pain is >right than left, but feels it across her lower abdomen. Has had irregular bleeding for a few months, sometimes light, other times 'like a period" Is getting over asthma attack/pneumonia.   Pertinent History Reviewed:  Medical & Surgical Hx:   Past Medical History:  Diagnosis Date  . Asthma   . Back pain   . Cervical pain   . Chronic bilateral low back pain with sciatica   . Constipation   . Degenerative lumbar disc   . Gonorrhea   . No pertinent past medical history   . Ovarian cyst   . Scoliosis   . Shortness of breath   . Trichomonas    Past Surgical History:  Procedure Laterality Date  . NO PAST SURGERIES    . TUBAL LIGATION  10/15/2011   Procedure: POST PARTUM TUBAL LIGATION;  Surgeon: Jonnie Kind, MD;  Location: [facility];  Service: Gynecology;  Laterality: Bilateral;  Post partum tubal ligation bilateral with filshie clips   Family History  Problem Relation Age of Onset  . Diabetes Mother   . Hypertension Mother   . Asthma Mother   . Diabetes Maternal Grandmother   . Heart attack Maternal Grandmother   . Hypertension Brother   . Asthma Brother   . Heart attack Maternal Grandfather   . Anesthesia problems Neg Hx   . Hypotension Neg Hx   . Malignant hyperthermia Neg Hx   . Pseudochol deficiency Neg Hx     Current Outpatient Medications:  .  atenolol (TENORMIN) 50 MG tablet, Take 50 mg by mouth daily., Disp: , Rfl:  .  azithromycin (ZITHROMAX) 500 MG tablet, Take by mouth daily., Disp: , Rfl:  .  methimazole (TAPAZOLE) 10 MG tablet, Take 2 tablets (20 mg total) by mouth daily., Disp: 60 tablet, Rfl: 3 .  ondansetron (ZOFRAN) 4 MG tablet, Take 4 mg by mouth 3 (three) times daily as needed., Disp: , Rfl:  .   oxyCODONE-acetaminophen (PERCOCET/ROXICET) 5-325 MG tablet, Take 7.5 tablets by mouth every 8 (eight) hours as needed. , Disp: , Rfl:  .  pantoprazole (PROTONIX) 40 MG tablet, Take 40 mg by mouth daily., Disp: , Rfl:  .  SYMBICORT 160-4.5 MCG/ACT inhaler, INHALE 2 PUFFS INTO THE LUNGS 2 (TWO) TIMES DAILY., Disp: 10.2 Inhaler, Rfl: 2 .  cyclobenzaprine (FLEXERIL) 10 MG tablet, Take 10 mg by mouth 2 (two) times daily after a meal., Disp: , Rfl:  .  doxycycline (VIBRAMYCIN) 100 MG capsule, Take 1 capsule (100 mg total) by mouth 2 (two) times daily., Disp: 14 capsule, Rfl: 0  Current Facility-Administered Medications:  .  betamethasone acetate-betamethasone sodium phosphate (CELESTONE) injection 6 mg, 6 mg, Intramuscular, Once, Claretta Fraise, MD Social History: Reviewed -  reports that she has been smoking cigarettes. She has a 5.00 pack-year smoking history. She has never used smokeless tobacco.  Review of Systems:   Constitutional: Negative for fever and chills Eyes: Negative for visual disturbances Respiratory: Negative for shortness of breath, dyspnea Cardiovascular: Negative for chest pain or palpitations  Gastrointestinal: Negative for vomiting, diarrhea and constipation; no abdominal pain Genitourinary: Negative for vaginal irritation or itching Musculoskeletal: Negative for back pain, joint pain, myalgias  Neurological: Negative for dizziness and headaches    Objective  Findings:    Physical Examination: Vitals:   03/18/19 1557  BP: 110/61  Pulse: 81   General appearance - well appearing, and in no distress Mental status - alert, oriented to person, place, and time Chest:  Normal respiratory effort Heart - normal rate and regular rhythm Abdomen:  Soft, nontender Pelvic: SSE:  Normal appearing scant discharge, some [place] blood. Cx sl friable  NUSWAB collected. Tender to bimanual , mainly on right side.  No CMT.  Musculoskeletal:  Normal range of motion without  pain Extremities:  No edema    No results found for this or any previous visit (from the past 24 hour(s)).    Assessment & Plan:  A:   Pelvic pain: infectious vs cyst?  P:  Rx doxycycline, pelvic US   Return for pelvic US and visit w/MD.  Christin Fudge CNM 03/18/2019 4:57 PM

## 2019-03-20 LAB — URINE CULTURE

## 2019-03-21 ENCOUNTER — Other Ambulatory Visit: Payer: Self-pay | Admitting: Advanced Practice Midwife

## 2019-03-21 DIAGNOSIS — A64 Unspecified sexually transmitted disease: Secondary | ICD-10-CM

## 2019-03-21 LAB — NUSWAB VAGINITIS PLUS (VG+)
Atopobium vaginae: HIGH Score — AB
Candida albicans, NAA: NEGATIVE
Candida glabrata, NAA: NEGATIVE
Chlamydia trachomatis, NAA: NEGATIVE
Neisseria gonorrhoeae, NAA: NEGATIVE
Trich vag by NAA: POSITIVE — AB

## 2019-03-21 MED ORDER — METRONIDAZOLE 500 MG PO TABS: 2000.0000 mg | ORAL_TABLET | Freq: Once | ORAL | 1 refills | Status: AC

## 2019-03-21 NOTE — Progress Notes (Signed)
Flagyl 2gm po for trich.  Awaiting partner's info

## 2019-03-26 ENCOUNTER — Other Ambulatory Visit: Payer: Self-pay | Admitting: Advanced Practice Midwife

## 2019-03-26 DIAGNOSIS — N941 Unspecified dyspareunia: Secondary | ICD-10-CM

## 2019-03-27 ENCOUNTER — Ambulatory Visit (INDEPENDENT_AMBULATORY_CARE_PROVIDER_SITE_OTHER): Payer: Medicaid Other

## 2019-03-27 ENCOUNTER — Ambulatory Visit (INDEPENDENT_AMBULATORY_CARE_PROVIDER_SITE_OTHER): Payer: Medicaid Other | Admitting: Women's Health

## 2019-03-27 ENCOUNTER — Encounter: Payer: Self-pay | Admitting: Women's Health

## 2019-03-27 ENCOUNTER — Other Ambulatory Visit: Payer: Self-pay

## 2019-03-27 VITALS — BP 111/6 | HR 89 | Ht 63.0 in | Wt 145.0 lb

## 2019-03-27 DIAGNOSIS — A599 Trichomoniasis, unspecified: Secondary | ICD-10-CM

## 2019-03-27 DIAGNOSIS — N926 Irregular menstruation, unspecified: Secondary | ICD-10-CM | POA: Diagnosis not present

## 2019-03-27 DIAGNOSIS — R102 Pelvic and perineal pain unspecified side: Secondary | ICD-10-CM

## 2019-03-27 DIAGNOSIS — N941 Unspecified dyspareunia: Secondary | ICD-10-CM

## 2019-03-27 DIAGNOSIS — N841 Polyp of cervix uteri: Secondary | ICD-10-CM

## 2019-03-27 MED ORDER — TINIDAZOLE 500 MG PO TABS: 2.0000 g | ORAL_TABLET | Freq: Once | ORAL | 0 refills | Status: AC

## 2019-03-27 NOTE — Progress Notes (Signed)
PELVIC US/TA:homogenous anteverted uterus,wnl,EEC 8.7 mm,echogenic mass with in the endocervical canal,no color flow visualized,? Polyp 2.3 x .6 x .9 cm,normal ovaries bilat,ovaries appear mobile,no free fluid,no pain during ultrasound

## 2019-03-27 NOTE — Patient Instructions (Signed)
Trichomoniasis Trichomoniasis is an STI (sexually transmitted infection) that can affect both women and men. In women, the outer area of the female genitalia (vulva) and the vagina are affected. In men, the penis is mainly affected, but the prostate and other reproductive organs can also be involved. This condition can be treated with medicine. It often has no symptoms (is asymptomatic), especially in men. What are the causes? This condition is caused by an organism called Trichomonas vaginalis. Trichomoniasis most often spreads from person to person (is contagious) through sexual contact. What increases the risk? The following factors may make you more likely to develop this condition:  Having unprotected sexual intercourse.  Having sexual intercourse with a partner who has trichomoniasis.  Having multiple sexual partners.  Having had previous trichomoniasis infections or other STIs. What are the signs or symptoms? In women, symptoms of trichomoniasis include:  Abnormal vaginal discharge that is clear, white, gray, or yellow-green and foamy and has an unusual "fishy" odor.  Itching and irritation of the vagina and vulva.  Burning or pain during urination or sexual intercourse.  Genital redness and swelling. In men, symptoms of trichomoniasis include:  Penile discharge that may be foamy or contain pus.  Pain in the penis. This may happen only when urinating.  Itching or irritation inside the penis.  Burning after urination or ejaculation. How is this diagnosed? In women, this condition may be found during a routine Pap test or physical exam. It may be found in men during a routine physical exam. Your health care provider may perform tests to help diagnose this infection, such as:  Urine tests (men and women).  The following in women: ? Testing the pH of the vagina. ? A vaginal swab test that checks for the Trichomonas vaginalis organism. ? Testing vaginal secretions. Your  health care provider may test you for other STIs, including HIV (human immunodeficiency virus). How is this treated? This condition is treated with medicine taken by mouth (orally), such as metronidazole or tinidazole to fight the infection. Your sexual partner(s) may also need to be tested and treated.  If you are a woman and you plan to become pregnant or think you may be pregnant, tell your health care provider right away. Some medicines that are used to treat the infection should not be taken during pregnancy. Your health care provider may recommend over-the-counter medicines or creams to help relieve itching or irritation. You may be tested for infection again 3 months after treatment. Follow these instructions at home:  Take and use over-the-counter and prescription medicines, including creams, only as told by your health care provider.  Do not have sexual intercourse until one week after you finish your medicine, or until your health care provider approves. Ask your health care provider when you may resume sexual intercourse.  (Women) Do not douche or wear tampons while you have the infection.  Discuss your infection with your sexual partner(s). Make sure that your partner gets tested and treated, if necessary.  Keep all follow-up visits as told by your health care provider. This is important. How is this prevented?  Use condoms every time you have sex. Using condoms correctly and consistently can help protect against STIs.  Avoid having multiple sexual partners.  Talk with your sexual partner about any symptoms that either of you may have, as well as any history of STIs.  Get tested for STIs and STDs (sexually transmitted diseases) before you have sex. Ask your partner to do the same.    Do not have sexual contact if you have symptoms of trichomoniasis or another STI. Contact a health care provider if:  You still have symptoms after you finish your medicine.  You develop pain in  your abdomen.  You have pain when you urinate.  You have bleeding after sexual intercourse.  You develop a rash.  You feel nauseous or you vomit.  You plan to become pregnant or think you may be pregnant. Summary  Trichomoniasis is an STI (sexually transmitted infection) that can affect both women and men.  This condition often has no symptoms (is asymptomatic), especially in men.  You should not have sexual intercourse until one week after you finish your medicine, or until your health care provider approves. Ask your health care provider when you may resume sexual intercourse.  Discuss your infection with your sexual partner. Make sure that your partner gets tested and treated, if necessary. This information is not intended to replace advice given to you by your health care provider. Make sure you discuss any questions you have with your health care provider. Document Released: 03/15/2001 Document Revised: 08/12/2016 Document Reviewed: 08/12/2016 Elsevier Interactive Patient Education  2019 [employer] Inc[employer]  

## 2019-03-27 NOTE — Progress Notes (Signed)
GYN VISIT Patient name: Rebecca Delacruz MRN [id number]  Date of birth: [birth year] Chief Complaint:   No chief complaint on file.  History of Present Illness:   Rebecca Delacruz is a 33 y.o. [phone] African American female being seen today for f/u after gyn u/s for pelvic pain, dyspareunia, and irregular bleeding. Was treated w/ doxycycline on 6/15 when she saw Manus Gunning. Had Nuswab that returned positive for trichomonas, pt is unaware of this dx, didn't read [employer], however she did get meds & take, but had sex again w/ partner who has not been treated.   Patient's last menstrual period was 02/20/2019. The current method of family planning is tubal ligation. Last pap 06/10/15. Results were:  normal Review of Systems:   Pertinent items are noted in HPI Denies fever/chills, dizziness, headaches, visual disturbances, fatigue, shortness of breath, chest pain, abdominal pain, vomiting, abnormal vaginal discharge/itching/odor/irritation, problems with periods, bowel movements, urination, or intercourse unless otherwise stated above.  Pertinent History Reviewed:  Reviewed past medical,surgical, social, obstetrical and family history.  Reviewed problem list, medications and allergies. Physical Assessment:   Vitals:   03/27/19 0929  BP: (!) 111/6  Pulse: 89  Weight: 145 lb (65.8 kg)  Height: 5\' 3"  (1.6 m)  Body mass index is 25.69 kg/m.       Physical Examination:   General appearance: alert, well appearing, and in no distress  Mental status: alert, oriented to person, place, and time  Skin: warm & dry   Cardiovascular: normal heart rate noted  Respiratory: normal respiratory effort, no distress  Abdomen: soft, non-tender   Pelvic: examination not indicated  Extremities: no edema   TODAY'S PELVIC U/S Rebecca Delacruz is a 33 y.o. [id number] she is here for a pelvic sonogram for irregular bleeding with dyspareunia.Marland Kitchen  Uterus                      7.8 x 4.3 x 5.9 cm, Total uterine volume  101 cc,homogenous anteverted uterus,wnl  Endometrium          8.7 mm, symmetrical, echogenic mass with in the endocervical canal,no color flow visualized,? Polyp 2.3 x .6 x .9 cm  Right ovary             3. x 2.5 x 2.1 cm, wnl  Left ovary                3 x 1.7 x 2.1 cm, wnl  No free fluid   Technician Comments:  PELVIC US/TA:homogenous anteverted uterus,wnl,EEC 8.7 mm,echogenic mass with in the endocervical canal,no color flow visualized,? Polyp 2.3 x .6 x .9 cm,normal ovaries bilat,ovaries appear mobile,no free fluid,no pain during ultrasound  [employer] 03/27/2019 9:30 AM  No results found for this or any previous visit (from the past 24 hour(s)).  Assessment & Plan:  1) Dyspareunia, pelvic pain, irregular bleeding> pelvic u/s shows 2.3cm echogenic mass in endocervical canal, likely polyp, reviewed w/ JVF who recommends bringing her back in for appt w/ MD for removal attempt  2) Trichomonas> retreat today since had sex w/ untreated partner, wants tindamax, called in flagyl for partner Maura Crandall DOB 02/28/79 NKDA to [place]. No sex until at least 7d from both finishing meds. POC at next visit  3) Past due for pap  Meds:  Meds ordered this encounter  Medications  . tinidazole (TINDAMAX) 500 MG tablet    Sig: Take 4 tablets (2,000 mg total) by mouth once for  1 dose.    Dispense:  4 tablet    Refill:  0    Order Specific Question:   Supervising Provider    Answer:   Elonda Husky, LUTHER H [[id number]]    No orders of the defined types were placed in this encounter.   Return in about 4 weeks (around 04/24/2019) for Pap & physical, TOC, and polyp removal w/ JVF.  [place], [facility] 03/27/2019 10:31 AM

## 2019-04-08 ENCOUNTER — Ambulatory Visit: Payer: Medicaid Other | Admitting: Internal Medicine

## 2019-04-16 ENCOUNTER — Encounter: Payer: Self-pay | Admitting: Orthopaedic Surgery

## 2019-04-16 ENCOUNTER — Other Ambulatory Visit: Payer: Self-pay

## 2019-04-16 ENCOUNTER — Ambulatory Visit: Payer: Medicaid Other | Admitting: Orthopaedic Surgery

## 2019-04-16 VITALS — BP 123/75 | HR 105 | Temp 97.4°F | Ht 62.0 in | Wt 141.5 lb

## 2019-04-16 DIAGNOSIS — G8929 Other chronic pain: Secondary | ICD-10-CM

## 2019-04-16 DIAGNOSIS — M25562 Pain in left knee: Secondary | ICD-10-CM

## 2019-04-16 DIAGNOSIS — M25561 Pain in right knee: Secondary | ICD-10-CM

## 2019-04-16 NOTE — Patient Instructions (Signed)

## 2019-04-16 NOTE — Progress Notes (Signed)
Patient Rebecca Delacruz [name], female DOB:06-07-1986, 33 y.o. [id number]  Chief Complaint  Patient presents with  . Knee Pain    R/ Sharp &achy/ both hurt    HPI  Rebecca Delacruz is a 33 y.o. female who has chronic right knee pain.  I saw her in August of last year and had her scheduled for MRI.  She went to prison and is just out last month.  She saw her family doctor on 03-29-2019 and is referred back here for knee evaluation. She has giving way of the right knee, swelling and popping that is not getting any better.  Nothing seems to help.  She is on chronic narcotics.    I will get MRI of the right knee.   Body mass index is 25.88 kg/m.  ROS  Review of Systems  Constitutional: Positive for activity change.  Respiratory: Positive for shortness of breath. Negative for cough.   Musculoskeletal: Positive for arthralgias, gait problem, joint swelling and neck pain.  All other systems reviewed and are negative.   All other systems reviewed and are negative.  The following is a summary of the past history medically, past history surgically, known current medicines, social history and family history.  This information is gathered electronically by the computer from prior information and documentation.  I review this each visit and have found including this information at this point in the chart is beneficial and informative.    Past Medical History:  Diagnosis Date  . Asthma   . Back pain   . Cervical pain   . Chronic bilateral low back pain with sciatica   . Constipation   . Degenerative lumbar disc   . Gonorrhea   . No pertinent past medical history   . Ovarian cyst   . Scoliosis   . Shortness of breath   . Trichomonas     Past Surgical History:  Procedure Laterality Date  . NO PAST SURGERIES    . TUBAL LIGATION  10/15/2011   Procedure: POST PARTUM TUBAL LIGATION;  Surgeon: Jonnie Kind, MD;  Location: [facility];  Service: Gynecology;  Laterality: Bilateral;  Post  partum tubal ligation bilateral with filshie clips    Family History  Problem Relation Age of Onset  . Diabetes Mother   . Hypertension Mother   . Asthma Mother   . Diabetes Maternal Grandmother   . Heart attack Maternal Grandmother   . Hypertension Brother   . Asthma Brother   . Heart attack Maternal Grandfather   . Anesthesia problems Neg Hx   . Hypotension Neg Hx   . Malignant hyperthermia Neg Hx   . Pseudochol deficiency Neg Hx     Social History Social History   Tobacco Use  . Smoking status: Current Every Day Smoker    Packs/day: 0.50    Years: 10.00    Pack years: 5.00    Types: Cigarettes  . Smokeless tobacco: Never Used  Substance Use Topics  . Alcohol use: Yes    Alcohol/week: 0.0 standard drinks    Comment: 3 weeks ago  . Drug use: No    Types: Marijuana    Comment: 09/09/12  last use around first of nov    Allergies  Allergen Reactions  . Codeine Hives and Nausea Only  . Aspirin Nausea Only and Palpitations    Current Outpatient Medications  Medication Sig Dispense Refill  . albuterol (ACCUNEB) 1.25 MG/3ML nebulizer solution Take 1 ampule by nebulization 2 (two) times a day.    Marland Kitchen  atenolol (TENORMIN) 50 MG tablet Take 50 mg by mouth daily.    . cyclobenzaprine (FLEXERIL) 10 MG tablet Take 10 mg by mouth 2 (two) times daily after a meal.    . gabapentin (NEURONTIN) 300 MG capsule TAKE 1 CAPSULE BY MOUTH THREE TIMES A DAY    . methimazole (TAPAZOLE) 10 MG tablet Take 2 tablets (20 mg total) by mouth daily. 60 tablet 3  . montelukast (SINGULAIR) 10 MG tablet Take 10 mg by mouth daily.    . ondansetron (ZOFRAN) 4 MG tablet Take 4 mg by mouth 3 (three) times daily as needed.    Marland Kitchen oxyCODONE-acetaminophen (PERCOCET/ROXICET) 5-325 MG tablet Take 7.5 tablets by mouth every 8 (eight) hours as needed.     . pantoprazole (PROTONIX) 40 MG tablet Take 40 mg by mouth daily.    . promethazine (PHENERGAN) 12.5 MG tablet Take 12.5 mg by mouth every 6 (six) hours as  needed.    . SYMBICORT 160-4.5 MCG/ACT inhaler INHALE 2 PUFFS INTO THE LUNGS 2 (TWO) TIMES DAILY. 10.2 Inhaler 2   Current Facility-Administered Medications  Medication Dose Route Frequency Provider Last Rate Last Dose  . betamethasone acetate-betamethasone sodium phosphate (CELESTONE) injection 6 mg  6 mg Intramuscular Once Claretta Fraise, MD         Physical Exam  Blood pressure 123/75, pulse (!) 105, height 5\' 2"  (1.575 m), weight 141 lb 8 oz (64.2 kg).  Constitutional: overall normal hygiene, normal nutrition, well developed, normal grooming, normal body habitus. Assistive device:none  Musculoskeletal: gait and station Limp right, muscle tone and strength are normal, no tremors or atrophy is present.  .  Neurological: coordination overall normal.  Deep tendon reflex/nerve stretch intact.  Sensation normal.  Cranial nerves II-XII intact.   Skin:   Normal overall no scars, lesions, ulcers or rashes. No psoriasis.  Psychiatric: Alert and oriented x 3.  Recent memory intact, remote memory unclear.  Normal mood and affect. Well groomed.  Good eye contact.  Cardiovascular: overall no swelling, no varicosities, no edema bilaterally, normal temperatures of the legs and arms, no clubbing, cyanosis and good capillary refill.  Lymphatic: palpation is normal.  Right knee has effusion 1+, crepitus medial joint line pain, ROM 0 to 110, positive medial [place], [place] intact.  All other systems reviewed and are negative   The patient has been educated about the nature of the problem(s) and counseled on treatment options.  The patient appeared to understand what I have discussed and is in agreement with it.  Encounter Diagnosis  Name Primary?  . Chronic pain of both knees Yes    PLAN Call if any problems.  Precautions discussed.  Continue current medications.   Return to clinic after MRI   Electronically Signed Sanjuana Kava, MD 7/14/20208:21 AM

## 2019-04-23 ENCOUNTER — Ambulatory Visit (HOSPITAL_COMMUNITY): Payer: Medicaid Other

## 2019-04-24 ENCOUNTER — Encounter: Payer: Medicaid Other | Admitting: Obstetrics and Gynecology

## 2019-05-06 ENCOUNTER — Encounter: Payer: Medicaid Other | Admitting: Obstetrics and Gynecology

## 2019-05-07 ENCOUNTER — Ambulatory Visit (HOSPITAL_COMMUNITY): Payer: Medicaid Other | Attending: Orthopaedic Surgery

## 2019-05-20 ENCOUNTER — Ambulatory Visit (INDEPENDENT_AMBULATORY_CARE_PROVIDER_SITE_OTHER): Payer: Medicaid Other | Admitting: Otolaryngology

## 2019-05-28 ENCOUNTER — Ambulatory Visit: Payer: Medicaid Other | Admitting: Obstetrics and Gynecology

## 2019-06-04 ENCOUNTER — Other Ambulatory Visit (HOSPITAL_COMMUNITY)
Admission: RE | Admit: 2019-06-04 | Discharge: 2019-06-04 | Disposition: A | Discharge status: Home / Self Care | Payer: Medicaid Other | Source: Ambulatory Visit | Attending: Obstetrics & Gynecology | Admitting: Obstetrics & Gynecology

## 2019-06-04 ENCOUNTER — Encounter: Payer: Self-pay | Admitting: Obstetrics & Gynecology

## 2019-06-04 ENCOUNTER — Other Ambulatory Visit: Payer: Self-pay

## 2019-06-04 ENCOUNTER — Ambulatory Visit: Payer: Medicaid Other | Admitting: Obstetrics & Gynecology

## 2019-06-04 VITALS — BP 126/72 | HR 105 | Ht 62.0 in | Wt 132.0 lb

## 2019-06-04 DIAGNOSIS — N814 Uterovaginal prolapse, unspecified: Secondary | ICD-10-CM | POA: Diagnosis present

## 2019-06-04 DIAGNOSIS — E0590 Thyrotoxicosis, unspecified without thyrotoxic crisis or storm: Secondary | ICD-10-CM

## 2019-06-04 DIAGNOSIS — D219 Benign neoplasm of connective and other soft tissue, unspecified: Secondary | ICD-10-CM | POA: Insufficient documentation

## 2019-06-04 DIAGNOSIS — N941 Unspecified dyspareunia: Secondary | ICD-10-CM

## 2019-06-04 NOTE — Addendum Note (Signed)
Addended by: Linton Rump on: 06/04/2019 09:47 AM   Modules accepted: Orders

## 2019-06-04 NOTE — Progress Notes (Signed)
Chief Complaint  Patient presents with  . "feels like uterus is falling out"      33 y.o. [id number] Patient's last menstrual period was 05/04/2019. The current method of family planning is tubal ligation.  Outpatient Encounter Medications as of 06/04/2019  Medication Sig  . albuterol (ACCUNEB) 1.25 MG/3ML nebulizer solution Take 1 ampule by nebulization as needed.   Marland Kitchen atenolol (TENORMIN) 50 MG tablet Take 50 mg by mouth daily.  Marland Kitchen gabapentin (NEURONTIN) 300 MG capsule TAKE 1 CAPSULE BY MOUTH THREE TIMES A DAY  . methimazole (TAPAZOLE) 10 MG tablet Take 2 tablets (20 mg total) by mouth daily.  . montelukast (SINGULAIR) 10 MG tablet Take 10 mg by mouth daily.  . ondansetron (ZOFRAN) 4 MG tablet Take 4 mg by mouth 3 (three) times daily as needed.  Marland Kitchen oxyCODONE-acetaminophen (PERCOCET/ROXICET) 5-325 MG tablet Take 7.5 tablets by mouth every 8 (eight) hours as needed.   . pantoprazole (PROTONIX) 40 MG tablet Take 40 mg by mouth daily.  . promethazine (PHENERGAN) 12.5 MG tablet Take 12.5 mg by mouth every 6 (six) hours as needed.  . SYMBICORT 160-4.5 MCG/ACT inhaler INHALE 2 PUFFS INTO THE LUNGS 2 (TWO) TIMES DAILY.  . [DISCONTINUED] cyclobenzaprine (FLEXERIL) 10 MG tablet Take 10 mg by mouth 2 (two) times daily after a meal.   Facility-Administered Encounter Medications as of 06/04/2019  Medication  . betamethasone acetate-betamethasone sodium phosphate (CELESTONE) injection 6 mg    Subjective Several month complaint of intermittent pelvic pressure and discomfort +dysparunia as well over same time frame The pelvic pain and pressure has worsened over the past few weeks Has had +trichomonas over the past 2 months and history of PID remotely No discharge fever or severe pelvic pain which has been constant Past Medical History:  Diagnosis Date  . Asthma   . Back pain   . Cervical pain   . Chronic bilateral low back pain with sciatica   . Constipation   . Degenerative lumbar disc    . Gonorrhea   . No pertinent past medical history   . Ovarian cyst   . Scoliosis   . Shortness of breath   . Thyroid disease    overactive  . Trichomonas     Past Surgical History:  Procedure Laterality Date  . NO PAST SURGERIES    . TUBAL LIGATION  10/15/2011   Procedure: POST PARTUM TUBAL LIGATION;  Surgeon: Jonnie Kind, MD;  Location: [facility];  Service: Gynecology;  Laterality: Bilateral;  Post partum tubal ligation bilateral with filshie clips    OB History    Gravida  5   Para  4   Term  3   Preterm  1   AB  1   Living  3     SAB      TAB      Ectopic      Multiple      Live Births  1        Obstetric Comments  Pt reported Abruption as part of del of [place], in 2010, but records deny any mention of abruption.  Pt had known FDIU prior to presentation in active labor, delivering promptly upon arrival  Urine Drug screen neg at time of del.        Allergies  Allergen Reactions  . Codeine Hives and Nausea Only  . Aspirin Nausea Only and Palpitations    Social History   Socioeconomic History  . Marital status: Single  Spouse name: Not on file  . Number of children: 3  . Years of education: Not on file  . Highest education level: Not on file  Occupational History  . Not on file  Social Needs  . Financial resource strain: Not on file  . Food insecurity    Worry: Not on file    Inability: Not on file  . Transportation needs    Medical: Not on file    Non-medical: Not on file  Tobacco Use  . Smoking status: Current Every Day Smoker    Packs/day: 0.50    Years: 10.00    Pack years: 5.00    Types: Cigarettes  . Smokeless tobacco: Never Used  Substance and Sexual Activity  . Alcohol use: Not Currently    Alcohol/week: 0.0 standard drinks  . Drug use: No    Types: Marijuana    Comment: 09/09/12  last use around first of nov  . Sexual activity: Yes    Birth control/protection: Surgical    Comment: tubal  Lifestyle  . Physical activity     Days per week: Not on file    Minutes per session: Not on file  . Stress: Not on file  Relationships  . Social Herbalist on phone: Not on file    Gets together: Not on file    Attends religious service: Not on file    Active member of club or organization: Not on file    Attends meetings of clubs or organizations: Not on file    Relationship status: Not on file  Other Topics Concern  . Not on file  Social History Narrative   ** Merged History Encounter **        Family History  Problem Relation Age of Onset  . Diabetes Mother   . Hypertension Mother   . Asthma Mother   . Diabetes Maternal Grandmother   . Heart attack Maternal Grandmother   . Hypertension Brother   . Asthma Brother   . Heart attack Maternal Grandfather   . Anesthesia problems Neg Hx   . Hypotension Neg Hx   . Malignant hyperthermia Neg Hx   . Pseudochol deficiency Neg Hx     Medications:       Current Outpatient Medications:  .  albuterol (ACCUNEB) 1.25 MG/3ML nebulizer solution, Take 1 ampule by nebulization as needed. , Disp: , Rfl:  .  atenolol (TENORMIN) 50 MG tablet, Take 50 mg by mouth daily., Disp: , Rfl:  .  gabapentin (NEURONTIN) 300 MG capsule, TAKE 1 CAPSULE BY MOUTH THREE TIMES A DAY, Disp: , Rfl:  .  methimazole (TAPAZOLE) 10 MG tablet, Take 2 tablets (20 mg total) by mouth daily., Disp: 60 tablet, Rfl: 3 .  montelukast (SINGULAIR) 10 MG tablet, Take 10 mg by mouth daily., Disp: , Rfl:  .  ondansetron (ZOFRAN) 4 MG tablet, Take 4 mg by mouth 3 (three) times daily as needed., Disp: , Rfl:  .  oxyCODONE-acetaminophen (PERCOCET/ROXICET) 5-325 MG tablet, Take 7.5 tablets by mouth every 8 (eight) hours as needed. , Disp: , Rfl:  .  pantoprazole (PROTONIX) 40 MG tablet, Take 40 mg by mouth daily., Disp: , Rfl:  .  promethazine (PHENERGAN) 12.5 MG tablet, Take 12.5 mg by mouth every 6 (six) hours as needed., Disp: , Rfl:  .  SYMBICORT 160-4.5 MCG/ACT inhaler, INHALE 2 PUFFS INTO THE  LUNGS 2 (TWO) TIMES DAILY., Disp: 10.2 Inhaler, Rfl: 2  Current Facility-Administered Medications:  .  betamethasone acetate-betamethasone sodium phosphate (CELESTONE) injection 6 mg, 6 mg, Intramuscular, Once, Claretta Fraise, MD  Objective Blood pressure 126/72, pulse (!) 105, height 5\' 2"  (1.575 m), weight 132 lb (59.9 kg), last menstrual period 05/04/2019.  General WDWN female NAD Vulva:  normal appearing vulva with no masses, tenderness or lesions Vagina:  normal mucosa, no discharge Cervix:  Grade II descent Normal no lesions Uterus:  ?posterior myoma retroverted Grade II relaxation normal size, contour, position, consistency, mobility, non-tender Adnexa: ovaries:present,  normal adnexa in size, nontender and no masses   Pertinent ROS No burning with urination, frequency or urgency No nausea, vomiting or diarrhea Nor fever chills or other constitutional symptoms   Labs or studies reviewed    Impression Diagnoses this Encounter::   ICD-10-CM   1. Fibroid  D21.9 US PELVIS (TRANSABDOMINAL ONLY)    US OB Transvaginal   sonogram for evaluation  2. Hyperthyroidism  E05.90 Thyroid Panel With TSH    Thyroid peroxidase antibody   labs pending has appointment with endocrinologist 06/27/2019  3. Dyspareunia, female  N94.10    related to pelvic relaxation but also check GC Chlamydia and recent history of trichomonas  4. 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   Labs or Scans Ordered: Orders Placed This Encounter  Procedures  . US PELVIS (TRANSABDOMINAL ONLY)  . US OB Transvaginal  . Thyroid Panel With TSH  . Thyroid peroxidase antibody    Management:: >see under impression above   Follow up Return in about 2 weeks (around 06/18/2019) for GYN sono, Follow  up, with Dr Elonda Husky.   All questions were answered.

## 2019-06-05 LAB — THYROID PANEL WITH TSH
Free Thyroxine Index: 9.3 — ABNORMAL HIGH (ref 1.2–4.9)
T3 Uptake Ratio: 53 % — ABNORMAL HIGH (ref 24–39)
T4, Total: 17.5 ug/dL — ABNORMAL HIGH (ref 4.5–12.0)
TSH: <0.005 u[IU]/mL — ABNORMAL LOW (ref 0.450–4.500)

## 2019-06-05 LAB — THYROID PEROXIDASE ANTIBODY: Thyroperoxidase Ab SerPl-aCnc: 386 IU/mL — ABNORMAL HIGH (ref 0–34)

## 2019-06-06 LAB — CYTOLOGY - PAP
Chlamydia: NEGATIVE
Diagnosis: NEGATIVE
HPV: NOT DETECTED
Neisseria Gonorrhea: NEGATIVE

## 2019-06-18 ENCOUNTER — Other Ambulatory Visit: Payer: Self-pay | Admitting: Obstetrics & Gynecology

## 2019-06-18 ENCOUNTER — Other Ambulatory Visit: Payer: Self-pay

## 2019-06-18 ENCOUNTER — Ambulatory Visit (INDEPENDENT_AMBULATORY_CARE_PROVIDER_SITE_OTHER): Payer: Medicaid Other

## 2019-06-18 DIAGNOSIS — R102 Pelvic and perineal pain: Secondary | ICD-10-CM | POA: Diagnosis not present

## 2019-06-18 DIAGNOSIS — D219 Benign neoplasm of connective and other soft tissue, unspecified: Secondary | ICD-10-CM

## 2019-06-18 NOTE — Progress Notes (Addendum)
PELVIC US TA/TV:homogeneous anteverted uterus,wnl,EEC 4 mm,no polyp visualized on today ultrasound,normal ovaries bilat,ovaries appear mobile,no pain during ultrasound

## 2019-06-24 ENCOUNTER — Ambulatory Visit (INDEPENDENT_AMBULATORY_CARE_PROVIDER_SITE_OTHER): Payer: Medicaid Other | Admitting: Otolaryngology

## 2019-06-24 ENCOUNTER — Ambulatory Visit: Payer: Medicaid Other | Admitting: Obstetrics & Gynecology

## 2019-07-09 ENCOUNTER — Other Ambulatory Visit: Payer: Self-pay

## 2019-07-09 ENCOUNTER — Encounter: Payer: Self-pay | Admitting: Obstetrics & Gynecology

## 2019-07-09 ENCOUNTER — Ambulatory Visit (INDEPENDENT_AMBULATORY_CARE_PROVIDER_SITE_OTHER): Payer: Medicaid Other | Admitting: Obstetrics & Gynecology

## 2019-07-09 VITALS — BP 121/76 | HR 108 | Ht 63.0 in | Wt 120.0 lb

## 2019-07-09 DIAGNOSIS — N921 Excessive and frequent menstruation with irregular cycle: Secondary | ICD-10-CM | POA: Diagnosis not present

## 2019-07-09 DIAGNOSIS — N814 Uterovaginal prolapse, unspecified: Secondary | ICD-10-CM

## 2019-07-09 DIAGNOSIS — N941 Unspecified dyspareunia: Secondary | ICD-10-CM

## 2019-07-09 DIAGNOSIS — E0590 Thyrotoxicosis, unspecified without thyrotoxic crisis or storm: Secondary | ICD-10-CM

## 2019-07-09 MED ORDER — MEGESTROL ACETATE 40 MG PO TABS: ORAL_TABLET | ORAL | 3 refills | Status: DC

## 2019-07-09 NOTE — Progress Notes (Signed)
Follow up appointment for results  Chief Complaint  Patient presents with  . Follow-up    Blood pressure 121/76, pulse (!) 108, height 5\' 3"  (1.6 m), weight 120 lb (54.4 kg), last menstrual period 07/01/2019.    GYNECOLOGIC SONOGRAM   Rebecca HOLTZEN is a 33 y.o. [id number] LMP 06/04/2019 she is here for a pelvic sonogram for pelvic pressure w/menorrhagia.  Uterus                      7.34 x 4 x 5.3 cm, Total uterine volume 81 cc,homogeneous anteverted uterus,wnl  Endometrium          4 mm, symmetrical, wnl,no polyp visualized on today ultrasound  Right ovary             2.6 x 1.7 x 2.3 cm, wnl  Left ovary                3.1 x 1.9 x 2.7 cm, wnl  No free fluid   Technician Comments: PELVIC US TA/TV:homogeneous anteverted uterus,wnl,EEC 4 mm,no polyp visualized on today ultrasound,normal ovaries bilat,ovaries appear mobile,no pain during ultrasound      [employer] 06/18/2019 5:15 PM  Clinical Impression and recommendations:  I have reviewed the sonogram results above, combined with the patient's current clinical course, below are my impressions and any appropriate recommendations for management based on the sonographic findings.  Uterus is normal size without structural abnormalities Endometrium is normal Ovaries are normal  No anatomic etiology for patient's symptoms are noted   Florian Buff 06/25/2019 10:25 AM   Recent Results (from the past 2160 hour(s))  Cytology - PAP( [place])     Status: None   Collection Time: 06/04/19 12:00 AM  Result Value Ref Range   Adequacy      Satisfactory for evaluation  endocervical/transformation zone component PRESENT.   Diagnosis      NEGATIVE FOR INTRAEPITHELIAL LESIONS OR MALIGNANCY.   Chlamydia Negative     Comment: Normal Reference Range - Negative   Neisseria Gonorrhea Negative     Comment: Normal Reference Range - Negative   HPV NOT Detected     Comment: Normal Reference Range - NOT Detected    Material Submitted CervicoVaginal Pap [ThinPrep Imaged]    CYTOLOGY - PAP PAP RESULT   Thyroid Panel With TSH     Status: Abnormal   Collection Time: 06/04/19  9:37 AM  Result Value Ref Range   TSH <0.005 (L) 0.450 - 4.500 uIU/mL   T4, Total 17.5 (H) 4.5 - 12.0 ug/dL   T3 Uptake Ratio 53 (H) 24 - 39 %   Free Thyroxine Index 9.3 (H) 1.2 - 4.9  Thyroid peroxidase antibody     Status: Abnormal   Collection Time: 06/04/19  9:37 AM  Result Value Ref Range   Thyroperoxidase Ab SerPl-aCnc 386 (H) 0 - 34 IU/mL       MEDS ordered this encounter: Meds ordered this encounter  Medications  . megestrol ([name]) 40 MG tablet    Sig: 1 tablet daily    Dispense:  30 tablet    Refill:  3    Orders for this encounter: No orders of the defined types were placed in this encounter.   Impression:   ICD-10-CM   1. 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 Hyperthyroidism  E05.90   3. Dyspareunia, female  N94.10   4. Menometrorrhagia  N92.1      Plan: >see above, follow up 3 months  Follow Up: Return in about 3 months (around 10/09/2019) for Follow up, with Dr Elonda Husky.       Face to face time:  15 minutes  Greater than 50% of the visit time was spent in counseling and coordination of care with the patient.  The summary and outline of the counseling and care coordination is summarized in the note above.   All questions were answered.  Past Medical History:  Diagnosis Date  . Asthma   . Back pain   . Cervical pain   . Chronic bilateral low back pain with sciatica   . Constipation   . Degenerative lumbar disc   . Gonorrhea   . No pertinent past medical history   . Ovarian cyst   . Scoliosis   . Shortness of breath   . Thyroid disease    overactive  . Trichomonas     Past Surgical History:  Procedure Laterality Date  . NO PAST  SURGERIES    . TUBAL LIGATION  10/15/2011   Procedure: POST PARTUM TUBAL LIGATION;  Surgeon: Jonnie Kind, MD;  Location: [facility];  Service: Gynecology;  Laterality: Bilateral;  Post partum tubal ligation bilateral with filshie clips    OB History    Gravida  5   Para  4   Term  3   Preterm  1   AB  1   Living  3     SAB      TAB      Ectopic      Multiple      Live Births  1        Obstetric Comments  Pt reported Abruption as part of del of [place], in 2010, but records deny any mention of abruption.  Pt had known FDIU prior to presentation in active labor, delivering promptly upon arrival  Urine Drug screen neg at time of del.        Allergies  Allergen Reactions  . Codeine Hives and Nausea Only  . Aspirin Nausea Only and Palpitations    Social History   Socioeconomic History  . Marital status: Single    Spouse name: Not on file  . Number of children: 3  . Years of education: Not on file  . Highest education level: Not on file  Occupational History  . Not on file  Social Needs  . Financial resource strain: Not on file  . Food insecurity    Worry: Not on file    Inability: Not on file  . Transportation needs    Medical: Not on file    Non-medical: Not on file  Tobacco Use  . Smoking status: Current Every Day Smoker    Packs/day: 0.50    Years: 10.00    Pack years: 5.00    Types: Cigarettes  . Smokeless tobacco: Never Used  Substance and Sexual Activity  . Alcohol use: Not Currently    Alcohol/week: 0.0 standard drinks  . Drug use: No    Types: Marijuana    Comment: 09/09/12  last use around first of nov  . Sexual activity: Yes    Birth control/protection: Surgical    Comment: tubal  Lifestyle  . Physical activity    Days per week: Not on file    Minutes per session: Not on file  .  Stress: Not on file  Relationships  . Social Herbalist on phone: Not on file    Gets together: Not on file    Attends religious service: Not on  file    Active member of club or organization: Not on file    Attends meetings of clubs or organizations: Not on file    Relationship status: Not on file  Other Topics Concern  . Not on file  Social History Narrative   ** Merged History Encounter **        Family History  Problem Relation Age of Onset  . Diabetes Mother   . Hypertension Mother   . Asthma Mother   . Diabetes Maternal Grandmother   . Heart attack Maternal Grandmother   . Hypertension Brother   . Asthma Brother   . Heart attack Maternal Grandfather   . Anesthesia problems Neg Hx   . Hypotension Neg Hx   . Malignant hyperthermia Neg Hx   . Pseudochol deficiency Neg Hx

## 2019-08-01 ENCOUNTER — Ambulatory Visit (INDEPENDENT_AMBULATORY_CARE_PROVIDER_SITE_OTHER): Payer: Medicaid Other | Admitting: Otolaryngology

## 2019-08-01 ENCOUNTER — Other Ambulatory Visit: Payer: Self-pay

## 2019-08-01 DIAGNOSIS — R1312 Dysphagia, oropharyngeal phase: Secondary | ICD-10-CM | POA: Diagnosis not present

## 2019-08-01 DIAGNOSIS — D440 Neoplasm of uncertain behavior of thyroid gland: Secondary | ICD-10-CM

## 2019-08-02 ENCOUNTER — Other Ambulatory Visit: Payer: Self-pay | Admitting: Otolaryngology

## 2019-08-02 ENCOUNTER — Other Ambulatory Visit (HOSPITAL_COMMUNITY): Payer: Self-pay | Admitting: Otolaryngology

## 2019-08-02 DIAGNOSIS — R1319 Other dysphagia: Secondary | ICD-10-CM

## 2019-08-02 DIAGNOSIS — R1310 Dysphagia, unspecified: Secondary | ICD-10-CM

## 2019-08-06 DIAGNOSIS — Z029 Encounter for administrative examinations, unspecified: Secondary | ICD-10-CM

## 2019-08-09 ENCOUNTER — Ambulatory Visit (HOSPITAL_COMMUNITY): Admission: RE | Admit: 2019-08-09 | Payer: Medicaid Other | Source: Ambulatory Visit

## 2019-08-09 ENCOUNTER — Encounter (HOSPITAL_COMMUNITY): Payer: Self-pay

## 2019-08-20 ENCOUNTER — Other Ambulatory Visit: Payer: Self-pay

## 2019-08-20 ENCOUNTER — Ambulatory Visit (INDEPENDENT_AMBULATORY_CARE_PROVIDER_SITE_OTHER): Payer: Medicaid Other | Admitting: "Endocrinology

## 2019-08-20 ENCOUNTER — Encounter: Payer: Self-pay | Admitting: "Endocrinology

## 2019-08-20 VITALS — BP 119/75 | HR 88 | Ht 63.0 in | Wt 113.0 lb

## 2019-08-20 DIAGNOSIS — E0590 Thyrotoxicosis, unspecified without thyrotoxic crisis or storm: Secondary | ICD-10-CM | POA: Diagnosis not present

## 2019-08-20 MED ORDER — PREDNISONE 10 MG PO TABS: 10.0000 mg | ORAL_TABLET | Freq: Every day | ORAL | 0 refills | Status: DC

## 2019-08-20 NOTE — Progress Notes (Signed)
08/20/2019      Endocrinology Consult Note    Subjective:    Patient ID: Rebecca Delacruz, female    DOB: 1986-08-16, PCP Adaline Sill, NP.   Past Medical History:  Diagnosis Date  . Asthma   . Back pain   . Cervical pain   . Chronic bilateral low back pain with sciatica   . Constipation   . Degenerative lumbar disc   . Gonorrhea   . No pertinent past medical history   . Ovarian cyst   . Scoliosis   . Shortness of breath   . Thyroid disease    overactive  . Trichomonas     Past Surgical History:  Procedure Laterality Date  . NO PAST SURGERIES    . TUBAL LIGATION  10/15/2011   Procedure: POST PARTUM TUBAL LIGATION;  Surgeon: Jonnie Kind, MD;  Location: [facility];  Service: Gynecology;  Laterality: Bilateral;  Post partum tubal ligation bilateral with filshie clips    Social History   Socioeconomic History  . Marital status: Single    Spouse name: Not on file  . Number of children: 3  . Years of education: Not on file  . Highest education level: Not on file  Occupational History  . Not on file  Social Needs  . Financial resource strain: Not on file  . Food insecurity    Worry: Not on file    Inability: Not on file  . Transportation needs    Medical: Not on file    Non-medical: Not on file  Tobacco Use  . Smoking status: Current Every Day Smoker    Packs/day: 0.50    Years: 10.00    Pack years: 5.00    Types: Cigarettes  . Smokeless tobacco: Never Used  Substance and Sexual Activity  . Alcohol use: Not Currently    Alcohol/week: 0.0 standard drinks  . Drug use: No    Types: Marijuana    Comment: 09/09/12  last use around first of nov  . Sexual activity: Yes    Birth control/protection: Surgical    Comment: tubal  Lifestyle  . Physical activity    Days per week: Not on file    Minutes per session: Not on file  . Stress: Not on file  Relationships  . Social Herbalist on phone: Not on file    Gets together: Not on file     Attends religious service: Not on file    Active member of club or organization: Not on file    Attends meetings of clubs or organizations: Not on file    Relationship status: Not on file  Other Topics Concern  . Not on file  Social History Narrative   ** Merged History Encounter **        Family History  Problem Relation Age of Onset  . Diabetes Mother   . Hypertension Mother   . Asthma Mother   . Diabetes Maternal Grandmother   . Heart attack Maternal Grandmother   . Hypertension Brother   . Asthma Brother   . Heart attack Maternal Grandfather   . Anesthesia problems Neg Hx   . Hypotension Neg Hx   . Malignant hyperthermia Neg Hx   . Pseudochol deficiency Neg Hx     Outpatient Encounter Medications as of 08/20/2019  Medication Sig  . albuterol (ACCUNEB) 1.25 MG/3ML nebulizer solution Take 1 ampule by nebulization as needed.   Marland Kitchen atenolol (TENORMIN) 50 MG tablet Take 50  mg by mouth daily.  . montelukast (SINGULAIR) 10 MG tablet Take 10 mg by mouth daily.  . ondansetron (ZOFRAN) 4 MG tablet Take 4 mg by mouth 3 (three) times daily as needed.  . pantoprazole (PROTONIX) 40 MG tablet Take 40 mg by mouth daily.  . promethazine (PHENERGAN) 12.5 MG tablet Take 12.5 mg by mouth every 6 (six) hours as needed.  . SYMBICORT 160-4.5 MCG/ACT inhaler INHALE 2 PUFFS INTO THE LUNGS 2 (TWO) TIMES DAILY.  Marland Kitchen gabapentin (NEURONTIN) 300 MG capsule TAKE 1 CAPSULE BY MOUTH THREE TIMES A DAY  . predniSONE (DELTASONE) 10 MG tablet Take 1 tablet (10 mg total) by mouth daily with breakfast.  . [DISCONTINUED] megestrol ([name]) 40 MG tablet 1 tablet daily  . [DISCONTINUED] methimazole (TAPAZOLE) 10 MG tablet Take 2 tablets (20 mg total) by mouth daily. (Patient not taking: Reported on 08/20/2019)  . [DISCONTINUED] oxyCODONE-acetaminophen (PERCOCET/ROXICET) 5-325 MG tablet Take 7.5 tablets by mouth every 8 (eight) hours as needed.    Facility-Administered Encounter Medications as of 08/20/2019   Medication  . betamethasone acetate-betamethasone sodium phosphate (CELESTONE) injection 6 mg    ALLERGIES: Allergies  Allergen Reactions  . Codeine Hives and Nausea Only  . Aspirin Nausea Only and Palpitations    VACCINATION STATUS: Immunization History  Administered Date(s) Administered  . PPD Test 03/22/2013, 04/12/2013, 06/02/2014  . Td 08/04/2016     HPI  Rebecca Delacruz is 33 y.o. female who presents today with a medical history as above. she is being seen in consultation for hyperthyroidism requested by Adaline Sill, NP.  she has been dealing with symptoms of weight loss, complications, tremors, and anxiety for more than a year. These symptoms are progressively worsening and troubling to her.  She has progressively lost 93 pounds since July 2019.  He is currently on 100 mg p.o. daily. her most recent thyroid labs revealed suppressed TSH and elevated thyroid hormone   on June 04, 2019.  She did not have thyroid uptake and scan.  She noticed increasing thyroid size progressively to visible goiter. she denies dysphagia, choking, shortness of breath, no recent voice change.  She denies nausea, vomiting, abdominal pain.  She has 3 children, not looking for any more fertility, status post tubal ligation.   She is a chronic active smoker. she has family history of thyroid dysfunction in her one of her aunts, but denies family hx of thyroid cancer. she denies personal history of goiter. she is not on any anti-thyroid medications nor on any thyroid hormone supplements. she  is willing to proceed with appropriate work up and therapy for thyrotoxicosis.                           Review of systems  Constitutional: + weight loss, + fatigue, + subjective hyperthermia Eyes: no blurry vision, ++ xerophthalmia ENT: no sore throat, no nodules palpated in throat, no dysphagia/odynophagia, nor hoarseness Cardiovascular: no Chest Pain, no Shortness of Breath, ++  Palpitations  responded to atenolol, no leg swelling Respiratory: no cough, no SOB Gastrointestinal: no Nausea, no Vomiting, no Diarhhea Musculoskeletal: no muscle/joint aches Skin: no rashes Neurological: ++  tremors, no numbness, no tingling, no dizziness Psychiatric: no depression, ++  anxiety   Objective:    BP 119/75   Pulse 88   Ht 5\' 3"  (1.6 m)   Wt 113 lb (51.3 kg)   BMI 20.02 kg/m   Wt Readings from Last 3 Encounters:  08/20/19 113 lb (51.3 kg)  07/09/19 120 lb (54.4 kg)  06/04/19 132 lb (59.9 kg)                                                Physical exam  Constitutional: Body mass index is 20.02 kg/m., not in acute distress, + anxious state of mind Eyes: PERRLA, EOMI, ++ exophthalmos ENT: moist mucous membranes, ++  Thyromegaly with  Bruit 3x normal size, no cervical lymphadenopathy Cardiovascular: + precordial activity, -tachycardic,  no Murmur/Rubs/Gallops Respiratory:  adequate breathing efforts, no gross chest deformity, Clear to auscultation bilaterally Gastrointestinal: abdomen soft, Non -tender, No distension, Bowel Sounds present Musculoskeletal: no gross deformities, strength intact in all four extremities Skin: moist, warm, no rashes Neurological: ++  tremor with outstretched hands,  ++ Deep Tendon Reflexes  on both lower extremities.   CMP     Component Value Date/Time   NA 139 01/17/2016 1736   NA 140 04/16/2015 1622   K 3.7 01/17/2016 1736   CL 106 01/17/2016 1736   CO2 25 01/17/2016 1736   GLUCOSE 92 01/17/2016 1736   BUN 9 01/17/2016 1736   BUN 11 04/16/2015 1622   CREATININE 0.77 01/17/2016 1736   CALCIUM 8.4 (L) 01/17/2016 1736   PROT 6.7 04/16/2015 1622   ALBUMIN 3.9 04/16/2015 1622   AST 15 04/16/2015 1622   ALT 14 04/16/2015 1622   ALKPHOS 91 04/16/2015 1622   BILITOT 0.2 04/16/2015 1622   GFRNONAA >60 01/17/2016 1736   GFRAA >60 01/17/2016 1736     CBC    Component Value Date/Time   WBC 7.3 04/18/2018 1025   RBC 4.78 04/18/2018 1025    HGB 12.5 04/18/2018 1025   HCT 37.6 04/18/2018 1025   PLT 265 04/18/2018 1025   MCV 78.7 (L) 04/18/2018 1025   MCV 90.1 04/16/2015 1629   MCH 26.2 (L) 04/18/2018 1025   MCHC 33.2 04/18/2018 1025   RDW 14.1 04/18/2018 1025   LYMPHSABS 1,840 04/18/2018 1025   MONOABS 0.4 01/17/2016 1736   EOSABS 234 04/18/2018 1025   BASOSABS 7 04/18/2018 1025     Diabetic Labs (most recent): Lab Results  Component Value Date   HGBA1C 5.3 12/26/2016     Results for YARETSY, GROULX (MRN [id number]) as of 08/20/2019 11:33  Ref. Range 06/04/2019 09:37  TSH Latest Ref Range: 0.450 - 4.500 uIU/mL <0.005 (L)  Thyroxine (T4) Latest Ref Range: 4.5 - 12.0 ug/dL 17.5 (H)  Free Thyroxine Index Latest Ref Range: 1.2 - 4.9  9.3 (H)  Thyroperoxidase Ab SerPl-aCnc Latest Ref Range: 0 - 34 IU/mL 386 (H)  T3 Uptake Ratio Latest Ref Range: 24 - 39 % 53 (H)     Assessment & Plan:   1. Hyperthyroidism  she is being seen at a kind request of Adaline Sill, NP. her history and most recent labs are reviewed, and she was examined clinically. 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 The potential risks of untreated thyrotoxicosis and the need for definitive therapy have been discussed in detail with her, and she agrees to proceed with diagnostic workup and treatment plan.   -She will be sent for confirmatory thyroid uptake and scan will be scheduled to be done as soon as possible in [facility] in the next couple of days.  Options of therapy are discussed with her.  She will benefit from ablative treatment with I-131.  She is too high risk to consider surgery at this time.  She will be given pretreatment with prednisone 10 mg p.o. daily for 20 days.  She is already on atenolol 50 mg p.o. daily.  She is urged to be consistent taking these medications. -Patient is made aware of the high likelihood of post ablative  hypothyroidism with subsequent need for lifelong thyroid hormone replacement. sheunderstands this outcome  and she is  willing to proceed.   -Given her clinically significant goiter, she will be considered for thyroid ultrasound on a later date.  She continues to have significant clinical goiter, will be considered for thyroidectomy at that time. -She will also need ophthalmology care given significant Graves' orbitopathy.  she will return in 5 days for treatment decision.   -Patient is advised to maintain close follow up with Adaline Sill, NP for primary care needs.   - Time spent with the patient: 60 minutes, of which >50% was spent in obtaining information about her symptoms, reviewing her previous labs, evaluations, and treatments, counseling her about her hyperthyroidism, and developing a plan to confirm the diagnosis and long term treatment as necessary. Please refer to " Patient Self Inventory" in the Media  tab for reviewed elements of pertinent patient history.  Rebecca Delacruz participated in the discussions, expressed understanding, and voiced agreement with the above plans.  All questions were answered to her satisfaction. she is encouraged to contact clinic should she have any questions or concerns prior to her return visit.   Follow up plan: Return in about 3 days (around 08/23/2019), or Scan Ordered for STAT, for Follow up with Thyroid Uptake and Scan.   Thank you for involving me in the care of this pleasant patient, and I will continue to update you with her progress.  Glade Lloyd, MD [facility] Phone: [phone]  Fax: [phone]   08/20/2019, 11:33 AM  This note was partially dictated with voice recognition software. Similar sounding words can be transcribed inadequately or may not  be corrected upon review.

## 2019-08-22 ENCOUNTER — Encounter (HOSPITAL_COMMUNITY): Admission: RE | Admit: 2019-08-22 | Payer: Medicaid Other | Source: Ambulatory Visit

## 2019-08-22 ENCOUNTER — Encounter (HOSPITAL_COMMUNITY): Payer: Self-pay

## 2019-08-22 ENCOUNTER — Other Ambulatory Visit: Payer: Self-pay

## 2019-08-22 ENCOUNTER — Encounter (HOSPITAL_COMMUNITY)
Admission: RE | Admit: 2019-08-22 | Discharge: 2019-08-22 | Disposition: A | Discharge status: Home / Self Care | Payer: Medicaid Other | Source: Ambulatory Visit | Attending: "Endocrinology | Admitting: "Endocrinology

## 2019-08-22 DIAGNOSIS — E0590 Thyrotoxicosis, unspecified without thyrotoxic crisis or storm: Secondary | ICD-10-CM

## 2019-08-22 MED ORDER — SODIUM IODIDE I-123 7.4 MBQ CAPS
421.0000 | ORAL_CAPSULE | Freq: Once | ORAL | Status: AC
  Administered 2019-08-22: 421 via ORAL

## 2019-08-23 ENCOUNTER — Encounter (HOSPITAL_COMMUNITY): Admission: RE | Admit: 2019-08-23 | Payer: Medicaid Other | Source: Ambulatory Visit

## 2019-08-23 ENCOUNTER — Ambulatory Visit: Payer: Medicaid Other | Admitting: "Endocrinology

## 2019-08-26 ENCOUNTER — Other Ambulatory Visit: Payer: Self-pay

## 2019-08-26 ENCOUNTER — Ambulatory Visit: Payer: Medicaid Other | Admitting: "Endocrinology

## 2019-08-26 ENCOUNTER — Telehealth: Payer: Self-pay | Admitting: "Endocrinology

## 2019-08-26 ENCOUNTER — Ambulatory Visit (INDEPENDENT_AMBULATORY_CARE_PROVIDER_SITE_OTHER): Payer: Medicaid Other | Admitting: "Endocrinology

## 2019-08-26 ENCOUNTER — Encounter: Payer: Self-pay | Admitting: "Endocrinology

## 2019-08-26 DIAGNOSIS — Z9119 Patient's noncompliance with other medical treatment and regimen: Secondary | ICD-10-CM | POA: Insufficient documentation

## 2019-08-26 DIAGNOSIS — E0590 Thyrotoxicosis, unspecified without thyrotoxic crisis or storm: Secondary | ICD-10-CM

## 2019-08-26 DIAGNOSIS — Z91199 Patient's noncompliance with other medical treatment and regimen due to unspecified reason: Secondary | ICD-10-CM | POA: Insufficient documentation

## 2019-08-26 MED ORDER — METHIMAZOLE 10 MG PO TABS: 10.0000 mg | ORAL_TABLET | Freq: Two times a day (BID) | ORAL | 1 refills | Status: DC

## 2019-08-26 MED ORDER — PREDNISONE 10 MG PO TABS: 10.0000 mg | ORAL_TABLET | Freq: Every day | ORAL | 0 refills | Status: DC

## 2019-08-26 NOTE — Telephone Encounter (Signed)
Called patient to check on her regarding her scan she was suppose to return and complete on Friday 11/20 at [facility]. Patient did not go back for the 4 hour study or the 24 hour study. This test was put in urgently for her by Dr Liliane Channel office. Dr Dorris Fetch still wanted her to come in and do her visit with him today, patient said this office was crazy and we needed to get our life together and she was not coming and hung up the phone.

## 2019-08-26 NOTE — Progress Notes (Signed)
08/26/2019                                     Endocrinology Telehealth Visit Follow up Note -During COVID -19 Pandemic  I connected with Rebecca Delacruz on 08/26/2019   by telephone and verified that I am speaking with the correct person using two identifiers. Rebecca Delacruz, 10-28-1985. she has verbally consented to this visit. All issues noted in this document were discussed and addressed. The format was not optimal for physical exam.    Subjective:    Patient ID: Rebecca Delacruz, female    DOB: 1985/10/06, PCP Adaline Sill, NP.   Past Medical History:  Diagnosis Date  . Asthma   . Back pain   . Cervical pain   . Chronic bilateral low back pain with sciatica   . Constipation   . Degenerative lumbar disc   . Gonorrhea   . No pertinent past medical history   . Ovarian cyst   . Scoliosis   . Shortness of breath   . Thyroid disease    overactive  . Trichomonas     Past Surgical History:  Procedure Laterality Date  . NO PAST SURGERIES    . TUBAL LIGATION  10/15/2011   Procedure: POST PARTUM TUBAL LIGATION;  Surgeon: Jonnie Kind, MD;  Location: [facility];  Service: Gynecology;  Laterality: Bilateral;  Post partum tubal ligation bilateral with filshie clips    Social History   Socioeconomic History  . Marital status: Single    Spouse name: Not on file  . Number of children: 3  . Years of education: Not on file  . Highest education level: Not on file  Occupational History  . Not on file  Social Needs  . Financial resource strain: Not on file  . Food insecurity    Worry: Not on file    Inability: Not on file  . Transportation needs    Medical: Not on file    Non-medical: Not on file  Tobacco Use  . Smoking status: Current Every Day Smoker    Packs/day: 0.50    Years: 10.00    Pack years: 5.00    Types: Cigarettes  . Smokeless tobacco: Never Used  Substance and Sexual Activity  . Alcohol use: Not Currently    Alcohol/week: 0.0 standard drinks   . Drug use: No    Types: Marijuana    Comment: 09/09/12  last use around first of nov  . Sexual activity: Yes    Birth control/protection: Surgical    Comment: tubal  Lifestyle  . Physical activity    Days per week: Not on file    Minutes per session: Not on file  . Stress: Not on file  Relationships  . Social Herbalist on phone: Not on file    Gets together: Not on file    Attends religious service: Not on file    Active member of club or organization: Not on file    Attends meetings of clubs or organizations: Not on file    Relationship status: Not on file  Other Topics Concern  . Not on file  Social History Narrative   ** Merged History Encounter **        Family History  Problem Relation Age of Onset  . Diabetes Mother   . Hypertension Mother   . Asthma Mother   .  Diabetes Maternal Grandmother   . Heart attack Maternal Grandmother   . Hypertension Brother   . Asthma Brother   . Heart attack Maternal Grandfather   . Anesthesia problems Neg Hx   . Hypotension Neg Hx   . Malignant hyperthermia Neg Hx   . Pseudochol deficiency Neg Hx     Outpatient Encounter Medications as of 08/26/2019  Medication Sig  . albuterol (ACCUNEB) 1.25 MG/3ML nebulizer solution Take 1 ampule by nebulization as needed.   Marland Kitchen atenolol (TENORMIN) 50 MG tablet Take 50 mg by mouth daily.  Marland Kitchen gabapentin (NEURONTIN) 300 MG capsule TAKE 1 CAPSULE BY MOUTH THREE TIMES A DAY  . methimazole (TAPAZOLE) 10 MG tablet Take 1 tablet (10 mg total) by mouth 2 (two) times daily.  . montelukast (SINGULAIR) 10 MG tablet Take 10 mg by mouth daily.  . ondansetron (ZOFRAN) 4 MG tablet Take 4 mg by mouth 3 (three) times daily as needed.  . pantoprazole (PROTONIX) 40 MG tablet Take 40 mg by mouth daily.  . predniSONE (DELTASONE) 10 MG tablet Take 1 tablet (10 mg total) by mouth daily with breakfast.  . promethazine (PHENERGAN) 12.5 MG tablet Take 12.5 mg by mouth every 6 (six) hours as needed.  .  SYMBICORT 160-4.5 MCG/ACT inhaler INHALE 2 PUFFS INTO THE LUNGS 2 (TWO) TIMES DAILY.  . [DISCONTINUED] predniSONE (DELTASONE) 10 MG tablet Take 1 tablet (10 mg total) by mouth daily with breakfast.   Facility-Administered Encounter Medications as of 08/26/2019  Medication  . betamethasone acetate-betamethasone sodium phosphate (CELESTONE) injection 6 mg    ALLERGIES: Allergies  Allergen Reactions  . Codeine Hives and Nausea Only  . Aspirin Nausea Only and Palpitations    VACCINATION STATUS: Immunization History  Administered Date(s) Administered  . PPD Test 03/22/2013, 04/12/2013, 06/02/2014  . Td 08/04/2016     HPI  Rebecca Delacruz is 33 y.o. female who presents today with a medical history as above. she is being engaged in telehealth via telephone after she was seen in consultation for hyperthyroidism requested by Adaline Sill, NP.  she has been dealing with symptoms of weight loss, complications, tremors, and anxiety for more than a year. These symptoms are progressively worsening and troubling to her.  She has progressively lost 93 pounds since July 2019.  her most recent thyroid labs revealed suppressed TSH and elevated thyroid hormone   on June 04, 2019.   During her last visit, she was approached for thyroid uptake and scan which was ordered and started on a stat basis.  Unfortunately, reportedly,  after she received I-131 , she did not return for the scan part of the study. -She has been very difficult to reach to, has been noncooperative/noncompliant for office visit.  After several attempts, she agreed to do a phone visit today.  See below. She noticed increasing thyroid size progressively to visible goiter. she denies dysphagia, choking, shortness of breath, no recent voice change.  She denies nausea, vomiting, abdominal pain.  She has 3 children, not looking for any more fertility, status post tubal ligation.   She is a chronic active smoker. she has family  history of thyroid dysfunction in her one of her aunts, but denies family hx of thyroid cancer. she denies personal history of goiter. she is not on any anti-thyroid medications nor on any thyroid hormone supplements. she  is willing to proceed with appropriate work up and therapy for thyrotoxicosis.  Review of systems Limited as above.  Objective:    There were no vitals taken for this visit.  Wt Readings from Last 3 Encounters:  08/20/19 113 lb (51.3 kg)  07/09/19 120 lb (54.4 kg)  06/04/19 132 lb (59.9 kg)                                                Physical exam-from prior visit  Constitutional: not in acute distress, + anxious state of mind Eyes: PERRLA, EOMI, ++ exophthalmos ENT: moist mucous membranes, ++  Thyromegaly with  Bruit 3x normal size, no cervical lymphadenopathy Cardiovascular: + precordial activity, -tachycardic,  no Murmur/Rubs/Gallops Respiratory:  adequate breathing efforts, no gross chest deformity, Clear to auscultation bilaterally Gastrointestinal: abdomen soft, Non -tender, No distension, Bowel Sounds present Musculoskeletal: no gross deformities, strength intact in all four extremities Skin: moist, warm, no rashes Neurological: ++  tremor with outstretched hands,  ++ Deep Tendon Reflexes  on both lower extremities.   CMP     Component Value Date/Time   NA 139 01/17/2016 1736   NA 140 04/16/2015 1622   K 3.7 01/17/2016 1736   CL 106 01/17/2016 1736   CO2 25 01/17/2016 1736   GLUCOSE 92 01/17/2016 1736   BUN 9 01/17/2016 1736   BUN 11 04/16/2015 1622   CREATININE 0.77 01/17/2016 1736   CALCIUM 8.4 (L) 01/17/2016 1736   PROT 6.7 04/16/2015 1622   ALBUMIN 3.9 04/16/2015 1622   AST 15 04/16/2015 1622   ALT 14 04/16/2015 1622   ALKPHOS 91 04/16/2015 1622   BILITOT 0.2 04/16/2015 1622   GFRNONAA >60 01/17/2016 1736   GFRAA >60 01/17/2016 1736     CBC    Component Value Date/Time   WBC 7.3 04/18/2018 1025   RBC  4.78 04/18/2018 1025   HGB 12.5 04/18/2018 1025   HCT 37.6 04/18/2018 1025   PLT 265 04/18/2018 1025   MCV 78.7 (L) 04/18/2018 1025   MCV 90.1 04/16/2015 1629   MCH 26.2 (L) 04/18/2018 1025   MCHC 33.2 04/18/2018 1025   RDW 14.1 04/18/2018 1025   LYMPHSABS 1,840 04/18/2018 1025   MONOABS 0.4 01/17/2016 1736   EOSABS 234 04/18/2018 1025   BASOSABS 7 04/18/2018 1025     Diabetic Labs (most recent): Lab Results  Component Value Date   HGBA1C 5.3 12/26/2016     Results for MALAIYA, FOISTER (MRN [id number]) as of 08/20/2019 11:33  Ref. Range 06/04/2019 09:37  TSH Latest Ref Range: 0.450 - 4.500 uIU/mL <0.005 (L)  Thyroxine (T4) Latest Ref Range: 4.5 - 12.0 ug/dL 17.5 (H)  Free Thyroxine Index Latest Ref Range: 1.2 - 4.9  9.3 (H)  Thyroperoxidase Ab SerPl-aCnc Latest Ref Range: 0 - 34 IU/mL 386 (H)  T3 Uptake Ratio Latest Ref Range: 24 - 39 % 53 (H)     Assessment & Plan:   1. Hyperthyroidism  2.  Noncompliance. -Tragically, she did not return to complete her thyroid uptake and scan.  She needs to be treated on an urgent basis.  She is very difficult to engage, and has an attitude.   The potential risks of untreated thyrotoxicosis including death and the need for definitive therapy have been discussed in detail with her, and she agrees to proceed with the alternative treatment plan.   -Ideally, she would need thyroid ablation with I-131, however,  another attempt to do this test will delay the need to control her thyrotoxicosis.     -I discussed and reemphasized the need for continued prednisone 10 mg p.o. daily, discussed and added Tapazole 10 mg p.o. twice daily, to start as soon as possible. -She will have repeat thyroid function test in 3 months with office visit in 4 months.  She is already on atenolol 50 mg p.o. daily.  She is urged to be consistent taking these medications. -She will be considered for definitive thyroid ablation with I-131 with proper scanning after she is  controlled to reasonable range.   -Given her clinically significant goiter, she will be considered for thyroid ultrasound on a later date. If she continues to have significant clinical goiter, will be considered for thyroidectomy at that time. -She will also need ophthalmology care given significant Graves' orbitopathy.   -Patient is advised to maintain close follow up with Adaline Sill, NP for primary care needs.   Time for this visit: 15 minutes. Rebecca Delacruz  participated in the discussions, expressed understanding, and voiced agreement with the above plans.  She voiced understanding .  all questions were answered to her satisfaction. she is encouraged to conact clinic should she have any questions or concerns prior to her return visit.   Follow up plan: Return in about 4 weeks (around 09/23/2019), or office, for Follow up with Pre-visit Labs.   Thank you for involving me in the care of this pleasant patient, and I will continue to update you with her progress.  Glade Lloyd, MD [facility] [place] [facility] Phone: [phone]  Fax: [phone]   08/26/2019, 4:34 PM  This note was partially dictated with voice recognition software. Similar sounding words can be transcribed inadequately or may not  be corrected upon review.

## 2019-08-28 ENCOUNTER — Inpatient Hospital Stay (HOSPITAL_COMMUNITY): Admission: RE | Admit: 2019-08-28 | Payer: Medicaid Other | Source: Ambulatory Visit

## 2019-08-28 ENCOUNTER — Encounter (HOSPITAL_COMMUNITY): Payer: Self-pay

## 2019-09-24 ENCOUNTER — Ambulatory Visit: Payer: Medicaid Other | Admitting: "Endocrinology

## 2019-10-10 ENCOUNTER — Ambulatory Visit: Payer: Medicaid Other | Admitting: Obstetrics & Gynecology

## 2019-10-14 ENCOUNTER — Ambulatory Visit: Payer: Medicaid Other | Admitting: "Endocrinology

## 2019-10-30 ENCOUNTER — Ambulatory Visit: Payer: Medicaid Other | Admitting: "Endocrinology

## 2019-11-05 ENCOUNTER — Telehealth: Payer: Self-pay | Admitting: "Endocrinology

## 2019-11-05 NOTE — Telephone Encounter (Signed)
Tried to reach pt per Dr Dorris Fetch to please have labs done and come back for an appt. No answer left a VM

## 2019-11-20 ENCOUNTER — Other Ambulatory Visit: Payer: Medicaid Other

## 2019-12-02 ENCOUNTER — Ambulatory Visit: Payer: Medicaid Other | Admitting: Obstetrics & Gynecology

## 2020-05-05 ENCOUNTER — Ambulatory Visit: Payer: Medicaid Other | Admitting: Obstetrics & Gynecology

## 2020-05-14 ENCOUNTER — Inpatient Hospital Stay (HOSPITAL_COMMUNITY)
Admission: EM | Admit: 2020-05-14 | Discharge: 2020-05-16 | DRG: 917 | Disposition: A | Discharge status: Home / Self Care | Payer: Medicaid Other | Attending: Internal Medicine | Admitting: Internal Medicine

## 2020-05-14 ENCOUNTER — Other Ambulatory Visit: Payer: Self-pay

## 2020-05-14 ENCOUNTER — Encounter (HOSPITAL_COMMUNITY): Payer: Self-pay

## 2020-05-14 ENCOUNTER — Emergency Department (HOSPITAL_COMMUNITY): Payer: Medicaid Other

## 2020-05-14 DIAGNOSIS — Z833 Family history of diabetes mellitus: Secondary | ICD-10-CM

## 2020-05-14 DIAGNOSIS — E872 Acidosis: Secondary | ICD-10-CM | POA: Diagnosis present

## 2020-05-14 DIAGNOSIS — Z20822 Contact with and (suspected) exposure to covid-19: Secondary | ICD-10-CM | POA: Diagnosis present

## 2020-05-14 DIAGNOSIS — E0500 Thyrotoxicosis with diffuse goiter without thyrotoxic crisis or storm: Secondary | ICD-10-CM | POA: Diagnosis present

## 2020-05-14 DIAGNOSIS — E0501 Thyrotoxicosis with diffuse goiter with thyrotoxic crisis or storm: Secondary | ICD-10-CM | POA: Diagnosis present

## 2020-05-14 DIAGNOSIS — F1410 Cocaine abuse, uncomplicated: Secondary | ICD-10-CM | POA: Diagnosis not present

## 2020-05-14 DIAGNOSIS — M419 Scoliosis, unspecified: Secondary | ICD-10-CM | POA: Diagnosis present

## 2020-05-14 DIAGNOSIS — Z7952 Long term (current) use of systemic steroids: Secondary | ICD-10-CM | POA: Diagnosis not present

## 2020-05-14 DIAGNOSIS — J45909 Unspecified asthma, uncomplicated: Secondary | ICD-10-CM | POA: Diagnosis present

## 2020-05-14 DIAGNOSIS — T424X1A Poisoning by benzodiazepines, accidental (unintentional), initial encounter: Secondary | ICD-10-CM | POA: Diagnosis present

## 2020-05-14 DIAGNOSIS — Z8249 Family history of ischemic heart disease and other diseases of the circulatory system: Secondary | ICD-10-CM

## 2020-05-14 DIAGNOSIS — M5441 Lumbago with sciatica, right side: Secondary | ICD-10-CM | POA: Diagnosis present

## 2020-05-14 DIAGNOSIS — R9431 Abnormal electrocardiogram [ECG] [EKG]: Secondary | ICD-10-CM

## 2020-05-14 DIAGNOSIS — F17210 Nicotine dependence, cigarettes, uncomplicated: Secondary | ICD-10-CM | POA: Diagnosis present

## 2020-05-14 DIAGNOSIS — G894 Chronic pain syndrome: Secondary | ICD-10-CM | POA: Diagnosis present

## 2020-05-14 DIAGNOSIS — T44901A Poisoning by unspecified drugs primarily affecting the autonomic nervous system, accidental (unintentional), initial encounter: Secondary | ICD-10-CM

## 2020-05-14 DIAGNOSIS — Z886 Allergy status to analgesic agent status: Secondary | ICD-10-CM

## 2020-05-14 DIAGNOSIS — M5442 Lumbago with sciatica, left side: Secondary | ICD-10-CM | POA: Diagnosis present

## 2020-05-14 DIAGNOSIS — Z79899 Other long term (current) drug therapy: Secondary | ICD-10-CM | POA: Diagnosis not present

## 2020-05-14 DIAGNOSIS — M5136 Other intervertebral disc degeneration, lumbar region: Secondary | ICD-10-CM | POA: Diagnosis present

## 2020-05-14 DIAGNOSIS — R6510 Systemic inflammatory response syndrome (SIRS) of non-infectious origin without acute organ dysfunction: Secondary | ICD-10-CM | POA: Diagnosis present

## 2020-05-14 DIAGNOSIS — R4182 Altered mental status, unspecified: Secondary | ICD-10-CM | POA: Diagnosis present

## 2020-05-14 DIAGNOSIS — Z825 Family history of asthma and other chronic lower respiratory diseases: Secondary | ICD-10-CM

## 2020-05-14 DIAGNOSIS — T405X1A Poisoning by cocaine, accidental (unintentional), initial encounter: Secondary | ICD-10-CM | POA: Diagnosis present

## 2020-05-14 DIAGNOSIS — Z885 Allergy status to narcotic agent status: Secondary | ICD-10-CM

## 2020-05-14 DIAGNOSIS — E0591 Thyrotoxicosis, unspecified with thyrotoxic crisis or storm: Secondary | ICD-10-CM

## 2020-05-14 DIAGNOSIS — H0520 Unspecified exophthalmos: Secondary | ICD-10-CM

## 2020-05-14 DIAGNOSIS — Z7951 Long term (current) use of inhaled steroids: Secondary | ICD-10-CM

## 2020-05-14 DIAGNOSIS — E0581 Other thyrotoxicosis with thyrotoxic crisis or storm: Secondary | ICD-10-CM

## 2020-05-14 LAB — COMPREHENSIVE METABOLIC PANEL
ALT: 22 U/L (ref 0–44)
AST: 21 U/L (ref 15–41)
Albumin: 3.8 g/dL (ref 3.5–5.0)
Alkaline Phosphatase: 83 U/L (ref 38–126)
Anion gap: 14 (ref 5–15)
BUN: 14 mg/dL (ref 6–20)
CO2: 20 mmol/L — ABNORMAL LOW (ref 22–32)
Calcium: 9 mg/dL (ref 8.9–10.3)
Chloride: 105 mmol/L (ref 98–111)
Creatinine, Ser: 0.63 mg/dL (ref 0.44–1.00)
GFR calc Af Amer: >60 mL/min (ref >60)
GFR calc non Af Amer: >60 mL/min (ref >60)
Glucose, Bld: 82 mg/dL (ref 70–99)
Potassium: 3.3 mmol/L — ABNORMAL LOW (ref 3.5–5.1)
Sodium: 139 mmol/L (ref 135–145)
Total Bilirubin: 0.8 mg/dL (ref 0.3–1.2)
Total Protein: 7.1 g/dL (ref 6.5–8.1)

## 2020-05-14 LAB — LACTIC ACID, PLASMA: Lactic Acid, Venous: 3.2 mmol/L (ref 0.5–1.9)

## 2020-05-14 LAB — SALICYLATE LEVEL: Salicylate Lvl: <7 mg/dL — ABNORMAL LOW (ref 7.0–30.0)

## 2020-05-14 LAB — URINALYSIS, ROUTINE W REFLEX MICROSCOPIC
Bilirubin Urine: NEGATIVE
Glucose, UA: NEGATIVE mg/dL
Hgb urine dipstick: NEGATIVE
Ketones, ur: 15 mg/dL — AB
Leukocytes,Ua: NEGATIVE
Nitrite: NEGATIVE
Specific Gravity, Urine: 1.025 (ref 1.005–1.030)
pH: 6 (ref 5.0–8.0)

## 2020-05-14 LAB — RAPID URINE DRUG SCREEN, HOSP PERFORMED
Amphetamines: NOT DETECTED
Barbiturates: NOT DETECTED
Benzodiazepines: POSITIVE — AB
Cocaine: POSITIVE — AB
Opiates: NOT DETECTED
Tetrahydrocannabinol: NOT DETECTED

## 2020-05-14 LAB — URINALYSIS, MICROSCOPIC (REFLEX)
Bacteria, UA: NONE SEEN
RBC / HPF: NONE SEEN RBC/hpf (ref 0–5)

## 2020-05-14 LAB — CBC
HCT: 39.1 % (ref 36.0–46.0)
Hemoglobin: 13.2 g/dL (ref 12.0–15.0)
MCH: 28.4 pg (ref 26.0–34.0)
MCHC: 33.8 g/dL (ref 30.0–36.0)
MCV: 84.3 fL (ref 80.0–100.0)
Platelets: 185 10*3/uL (ref 150–400)
RBC: 4.64 MIL/uL (ref 3.87–5.11)
RDW: 12.6 % (ref 11.5–15.5)
WBC: 7.2 10*3/uL (ref 4.0–10.5)
nRBC: 0 % (ref 0.0–0.2)

## 2020-05-14 LAB — TSH: TSH: <0.01 u[IU]/mL — ABNORMAL LOW (ref 0.350–4.500)

## 2020-05-14 LAB — SARS CORONAVIRUS 2 BY RT PCR (HOSPITAL ORDER, PERFORMED IN ~~LOC~~ HOSPITAL LAB): SARS Coronavirus 2: NEGATIVE

## 2020-05-14 LAB — TROPONIN I (HIGH SENSITIVITY)
Troponin I (High Sensitivity): 7 ng/L (ref <18)
Troponin I (High Sensitivity): 28 ng/L — ABNORMAL HIGH (ref <18)

## 2020-05-14 LAB — ACETAMINOPHEN LEVEL: Acetaminophen (Tylenol), Serum: <10 ug/mL — ABNORMAL LOW (ref 10–30)

## 2020-05-14 LAB — APTT: aPTT: 25 seconds (ref 24–36)

## 2020-05-14 LAB — T4, FREE: Free T4: 4.35 ng/dL — ABNORMAL HIGH (ref 0.61–1.12)

## 2020-05-14 LAB — ETHANOL: Alcohol, Ethyl (B): <10 mg/dL (ref <10)

## 2020-05-14 LAB — HCG, SERUM, QUALITATIVE: Preg, Serum: NEGATIVE

## 2020-05-14 LAB — PROTIME-INR
INR: 1.1 (ref 0.8–1.2)
Prothrombin Time: 14.2 seconds (ref 11.4–15.2)

## 2020-05-14 LAB — CK: Total CK: 293 U/L — ABNORMAL HIGH (ref 38–234)

## 2020-05-14 LAB — PROCALCITONIN: Procalcitonin: <0.1 ng/mL

## 2020-05-14 MED ORDER — POTASSIUM IODIDE (EXPECTORANT) 1 GM/ML PO SOLN
200.0000 mg | Freq: Four times a day (QID) | ORAL | Status: DC
  Administered 2020-05-14 – 2020-05-15 (×6): 200 mg via ORAL
  Filled 2020-05-14: qty 30

## 2020-05-14 MED ORDER — METHIMAZOLE 5 MG PO TABS
10.0000 mg | ORAL_TABLET | Freq: Two times a day (BID) | ORAL | Status: DC
  Administered 2020-05-14 – 2020-05-15 (×2): 10 mg via ORAL
  Filled 2020-05-14 (×3): qty 2

## 2020-05-14 MED ORDER — MOMETASONE FURO-FORMOTEROL FUM 100-5 MCG/ACT IN AERO
2.0000 | INHALATION_SPRAY | Freq: Two times a day (BID) | RESPIRATORY_TRACT | Status: DC
  Administered 2020-05-14 – 2020-05-15 (×3): 2 via RESPIRATORY_TRACT
  Filled 2020-05-14: qty 8.8

## 2020-05-14 MED ORDER — SODIUM CHLORIDE 0.9 % IV BOLUS
1000.0000 mL | Freq: Once | INTRAVENOUS | Status: AC
  Administered 2020-05-14: 1000 mL via INTRAVENOUS

## 2020-05-14 MED ORDER — ENOXAPARIN SODIUM 40 MG/0.4ML ~~LOC~~ SOLN
40.0000 mg | SUBCUTANEOUS | Status: DC
  Administered 2020-05-14: 40 mg via SUBCUTANEOUS
  Filled 2020-05-14: qty 0.4

## 2020-05-14 MED ORDER — ALPRAZOLAM 0.5 MG PO TABS
0.5000 mg | ORAL_TABLET | Freq: Two times a day (BID) | ORAL | Status: DC | PRN
  Administered 2020-05-14 – 2020-05-15 (×2): 0.5 mg via ORAL
  Filled 2020-05-14 (×2): qty 1

## 2020-05-14 MED ORDER — MIDAZOLAM HCL 5 MG/5ML IJ SOLN
INTRAMUSCULAR | Status: AC
  Administered 2020-05-14: 5 mg via INTRAVENOUS
  Filled 2020-05-14: qty 5

## 2020-05-14 MED ORDER — ONDANSETRON HCL 4 MG PO TABS: 4.0000 mg | ORAL_TABLET | Freq: Four times a day (QID) | ORAL | Status: DC | PRN

## 2020-05-14 MED ORDER — OXYCODONE HCL 5 MG PO TABS
10.0000 mg | ORAL_TABLET | Freq: Four times a day (QID) | ORAL | Status: AC | PRN
  Administered 2020-05-14 – 2020-05-15 (×2): 10 mg via ORAL
  Filled 2020-05-14 (×2): qty 2

## 2020-05-14 MED ORDER — POTASSIUM CHLORIDE IN NACL 20-0.9 MEQ/L-% IV SOLN
INTRAVENOUS | Status: DC
  Filled 2020-05-14: qty 1000

## 2020-05-14 MED ORDER — VANCOMYCIN HCL IN DEXTROSE 1-5 GM/200ML-% IV SOLN
1000.0000 mg | INTRAVENOUS | Status: DC
  Filled 2020-05-14: qty 200

## 2020-05-14 MED ORDER — MAGNESIUM SULFATE 2 GM/50ML IV SOLN
2.0000 g | Freq: Once | INTRAVENOUS | Status: AC
  Administered 2020-05-14: 2 g via INTRAVENOUS
  Filled 2020-05-14: qty 50

## 2020-05-14 MED ORDER — ACETAMINOPHEN 325 MG PO TABS: 650.0000 mg | ORAL_TABLET | Freq: Four times a day (QID) | ORAL | Status: DC | PRN

## 2020-05-14 MED ORDER — PROPRANOLOL HCL 20 MG PO TABS
10.0000 mg | ORAL_TABLET | Freq: Three times a day (TID) | ORAL | Status: DC
  Administered 2020-05-14 – 2020-05-15 (×4): 10 mg via ORAL
  Filled 2020-05-14 (×5): qty 1

## 2020-05-14 MED ORDER — SODIUM CHLORIDE 0.9 % IV SOLN
2.0000 g | Freq: Three times a day (TID) | INTRAVENOUS | Status: DC
  Administered 2020-05-14 – 2020-05-15 (×3): 2 g via INTRAVENOUS
  Filled 2020-05-14 (×3): qty 2

## 2020-05-14 MED ORDER — HYDROCORTISONE (PERIANAL) 2.5 % EX CREA
TOPICAL_CREAM | Freq: Three times a day (TID) | CUTANEOUS | Status: DC
  Filled 2020-05-14 (×2): qty 28.35

## 2020-05-14 MED ORDER — MONTELUKAST SODIUM 10 MG PO TABS
10.0000 mg | ORAL_TABLET | Freq: Every day | ORAL | Status: DC
  Administered 2020-05-14: 10 mg via ORAL
  Filled 2020-05-14 (×2): qty 1

## 2020-05-14 MED ORDER — SODIUM CHLORIDE 0.9 % IV SOLN
2.0000 g | Freq: Once | INTRAVENOUS | Status: AC
  Administered 2020-05-14: 2 g via INTRAVENOUS
  Filled 2020-05-14: qty 2

## 2020-05-14 MED ORDER — HYDROCORTISONE NA SUCCINATE PF 100 MG IJ SOLR
100.0000 mg | Freq: Three times a day (TID) | INTRAMUSCULAR | Status: DC
  Administered 2020-05-14 – 2020-05-15 (×4): 100 mg via INTRAVENOUS
  Filled 2020-05-14 (×5): qty 2

## 2020-05-14 MED ORDER — ATENOLOL 25 MG PO TABS
50.0000 mg | ORAL_TABLET | Freq: Every day | ORAL | Status: DC
  Administered 2020-05-14 – 2020-05-15 (×2): 50 mg via ORAL
  Filled 2020-05-14 (×2): qty 2

## 2020-05-14 MED ORDER — MIDAZOLAM HCL 5 MG/5ML IJ SOLN: 5.0000 mg | Freq: Once | INTRAMUSCULAR | Status: AC

## 2020-05-14 MED ORDER — LACTATED RINGERS IV BOLUS
1000.0000 mL | Freq: Once | INTRAVENOUS | Status: AC
  Administered 2020-05-14: 1000 mL via INTRAVENOUS

## 2020-05-14 MED ORDER — VANCOMYCIN HCL IN DEXTROSE 1-5 GM/200ML-% IV SOLN
1000.0000 mg | Freq: Once | INTRAVENOUS | Status: AC
  Administered 2020-05-14: 1000 mg via INTRAVENOUS
  Filled 2020-05-14: qty 200

## 2020-05-14 MED ORDER — ONDANSETRON HCL 4 MG/2ML IJ SOLN: 4.0000 mg | Freq: Four times a day (QID) | INTRAMUSCULAR | Status: DC | PRN

## 2020-05-14 MED ORDER — PROPYLTHIOURACIL 50 MG PO TABS
200.0000 mg | ORAL_TABLET | Freq: Three times a day (TID) | ORAL | Status: DC
  Administered 2020-05-14 – 2020-05-15 (×4): 200 mg via ORAL
  Filled 2020-05-14 (×14): qty 4

## 2020-05-14 MED ORDER — ACETAMINOPHEN 650 MG RE SUPP: 650.0000 mg | Freq: Four times a day (QID) | RECTAL | Status: DC | PRN

## 2020-05-14 MED ORDER — VILAZODONE HCL 20 MG PO TABS
1.0000 | ORAL_TABLET | Freq: Every day | ORAL | Status: DC
  Administered 2020-05-15: 20 mg via ORAL
  Filled 2020-05-14 (×4): qty 1

## 2020-05-14 MED ORDER — PANTOPRAZOLE SODIUM 40 MG PO TBEC
40.0000 mg | DELAYED_RELEASE_TABLET | Freq: Every day | ORAL | Status: DC
  Administered 2020-05-14 – 2020-05-15 (×2): 40 mg via ORAL
  Filled 2020-05-14 (×2): qty 1

## 2020-05-14 MED ORDER — GABAPENTIN 100 MG PO CAPS
100.0000 mg | ORAL_CAPSULE | Freq: Three times a day (TID) | ORAL | Status: DC
  Administered 2020-05-14 – 2020-05-15 (×3): 100 mg via ORAL
  Filled 2020-05-14 (×3): qty 1

## 2020-05-14 MED ORDER — LOPERAMIDE HCL 2 MG PO CAPS
2.0000 mg | ORAL_CAPSULE | ORAL | Status: DC | PRN
  Administered 2020-05-14: 2 mg via ORAL
  Filled 2020-05-14: qty 1

## 2020-05-14 NOTE — H&P (Signed)
History and Physical  Rebecca Delacruz:[id number] DOB: March 12, 1986 DOA: 05/14/2020   PCP: Adaline Sill, NP   Patient coming from: Home  Chief Complaint: confusion/agitation  HPI:  Rebecca Delacruz is a 34 y.o. female with medical history of, chronic pain syndrome, asthma presenting with confusion and agitation.  Currently, patient arrived in the emergency department under police custody.  She states that she had been smoking some crack cocaine earlier in the day of admission 05/14/20 .  Was noted to be running but remained overnight.  The police were called to evaluate she was noted to be combative confused in the emergency department.  Patient at the time of my evaluation, with patient somewhat worsened.  She denies any alcohol use or any new.  She states that she has been using crack cocaine.  She states that she had not used for the last 2 months well-controlled.  She denies any fevers, chills, headache, chest pain, shortness breath, coughing, hemoptysis, nausea, vomiting, diarrhea.  Denies any headache or neck pain. In the emergency department, patient was febrile up to 1 2.2 F but was hemodynamically stable.  Heart rate was in the 150 range.  BMP and LFTs were unremarkable.  WBC 7.2, hemoglobin 13.2, platelets 105,000.  Urine drug screen positive for cocaine and benzodiazepines.  TSH was <0.05.  Chest x-ray showed chronic bronchitic markings.  Troponin was negative.  Lactic acid peaked at 3.2.  Assessment/Plan: SIRS -Presented with fever, tachycardia, and tachypnea -Concerned about sepsis/bacteremia versus thyroid storm -Check procalcitonin -Repeat lactic acid -Start IV fluids -Start empiric vancomycin and cefepime   Graves' disease/hyperthyroidism -Concerned about thyroid storm -discussed with pt's endocrinologist--Dr. Nida-->start SSKI 5 drops q 6 hours, hydrocortisone IV and propranolol -Check free T4 and Free T3 daily -TSH <0.05  Cocaine abuse -Patient was  drowsy  Lactic acidosis -start IVF -check PCT -start empiric abx -cycle lactic acid       Past Medical History:  Diagnosis Date  . Asthma   . Back pain   . Cervical pain   . Chronic bilateral low back pain with sciatica   . Constipation   . Degenerative lumbar disc   . Gonorrhea   . No pertinent past medical history   . Ovarian cyst   . Scoliosis   . Shortness of breath   . Thyroid disease    overactive  . Trichomonas    Past Surgical History:  Procedure Laterality Date  . NO PAST SURGERIES    . TUBAL LIGATION  10/15/2011   Procedure: POST PARTUM TUBAL LIGATION;  Surgeon: Jonnie Kind, MD;  Location: [facility];  Service: Gynecology;  Laterality: Bilateral;  Post partum tubal ligation bilateral with filshie clips   Social History:  reports that she has been smoking cigarettes. She has a 5.00 pack-year smoking history. She has never used smokeless tobacco. She reports previous alcohol use. She reports that she does not use drugs.   Family History  Problem Relation Age of Onset  . Diabetes Mother   . Hypertension Mother   . Asthma Mother   . Diabetes Maternal Grandmother   . Heart attack Maternal Grandmother   . Hypertension Brother   . Asthma Brother   . Heart attack Maternal Grandfather   . Anesthesia problems Neg Hx   . Hypotension Neg Hx   . Malignant hyperthermia Neg Hx   . Pseudochol deficiency Neg Hx      Allergies  Allergen Reactions  . Codeine Hives  and Nausea Only  . Aspirin Nausea Only and Palpitations     Prior to Admission medications   Medication Sig Start Date End Date Taking? Authorizing Provider  albuterol (ACCUNEB) 1.25 MG/3ML nebulizer solution Take 1 ampule by nebulization as needed.    Yes [provider]  atenolol (TENORMIN) 50 MG tablet Take 50 mg by mouth daily. 12/28/18  Yes [provider]  budesonide-formoterol (SYMBICORT) 80-4.5 MCG/ACT inhaler TAKE 2 PUFFS BY MOUTH EVERY DAY 07/30/19  Yes [provider]  gabapentin (NEURONTIN) 300 MG capsule TAKE 1 CAPSULE BY MOUTH THREE TIMES A DAY 02/26/19  Yes [provider]  methimazole (TAPAZOLE) 10 MG tablet Take 1 tablet (10 mg total) by mouth 2 (two) times daily. 08/26/19  Yes Nida, Marella Chimes, MD  montelukast (SINGULAIR) 10 MG tablet Take 10 mg by mouth daily. 01/29/19  Yes [provider]  pantoprazole (PROTONIX) 40 MG tablet Take 40 mg by mouth daily. 12/28/18  Yes [provider]  SYMBICORT 160-4.5 MCG/ACT inhaler INHALE 2 PUFFS INTO THE LUNGS 2 (TWO) TIMES DAILY. 01/10/17  Yes Stacks, Cletus Gash, MD  ALPRAZolam Duanne Moron) 0.5 MG tablet SMARTSIG:1-2.5 Tablet(s) By Mouth Twice Daily PRN 04/25/20   [provider]  ondansetron (ZOFRAN) 4 MG tablet Take 4 mg by mouth 3 (three) times daily as needed. 12/28/18   [provider]  oxyCODONE-acetaminophen (PERCOCET) 10-325 MG tablet Take 1 tablet by mouth every 6 (six) hours. 04/21/20   [provider]  predniSONE (DELTASONE) 10 MG tablet Take 1 tablet (10 mg total) by mouth daily with breakfast. 08/26/19   Nida, Marella Chimes, MD  promethazine (PHENERGAN) 12.5 MG tablet Take 12.5 mg by mouth every 6 (six) hours as needed. 01/28/19   [provider]  VIIBRYD 20 MG TABS Take 1 tablet by mouth daily. 04/20/20   [provider]    Review of Systems:  Constitutional:  No weight loss, night sweats, Fevers, chills, fatigue.  Head&Eyes: No headache.  No vision loss.  No eye pain or scotoma ENT:  No Difficulty swallowing,Tooth/dental problems,Sore throat,  No ear ache, post nasal drip,  Cardio-vascular:  No chest pain, Orthopnea, PND, swelling in lower extremities,  dizziness, palpitations  GI:  No  abdominal pain, nausea, vomiting, diarrhea, loss of appetite, hematochezia, melena, heartburn, indigestion, Resp:  No shortness of breath with exertion or at rest. No cough. No coughing up of blood .No wheezing.No chest wall deformity   Skin:  no rash or lesions.  GU:  no dysuria, change in color of urine, no urgency or frequency. No flank pain.  Musculoskeletal:  No joint pain or swelling. No decreased range of motion. No back pain.  Psych:  No change in mood or affect. No depression or anxiety. Neurologic: No headache, no dysesthesia, no focal weakness, no vision loss. No syncope  Physical Exam: Vitals:   05/14/20 1034  BP: (!) 149/115  Pulse: (!) 229  Resp: (!) 24  Temp: (!) 102.2 F (39 C)  TempSrc: Oral  SpO2: 95%   General:  A&O x 2, NAD, nontoxic, pleasant/cooperative Head/Eye: No conjunctival hemorrhage, no icterus, [place]/AT, No nystagmus ENT:  No icterus,  No thrush, good dentition, no pharyngeal exudate +exophtalmos Neck:  No masses, no lymphadenpathy, no bruits CV:  RRR, no rub, no gallop, no S3 Lung:  Bibasilar crackles. No wheeze Abdomen: soft/NT, +BS, nondistended, no peritoneal signs Ext: No cyanosis, No rashes, No petechiae, No lymphangitis, No edema Neuro: CNII-XII intact, strength 4/5 in bilateral upper and  lower extremities, no dysmetria  Labs on Admission:  Basic Metabolic Panel: Recent Labs  Lab 05/14/20 1107  NA 139  K 3.3*  CL 105  CO2 20*  GLUCOSE 82  BUN 14  CREATININE 0.63  CALCIUM 9.0   Liver Function Tests: Recent Labs  Lab 05/14/20 1107  AST 21  ALT 22  ALKPHOS 83  BILITOT 0.8  PROT 7.1  ALBUMIN 3.8   No results for input(s): LIPASE, AMYLASE in the last 168 hours. No results for input(s): AMMONIA in the last 168 hours. CBC: Recent Labs  Lab 05/14/20 1107  WBC 7.2  HGB 13.2  HCT 39.1  MCV 84.3  PLT 185   Coagulation Profile: No results for input(s): INR, PROTIME in the last 168 hours. Cardiac Enzymes: Recent Labs  Lab 05/14/20 1107  CKTOTAL 293*   BNP: Invalid input(s): POCBNP CBG: No results for input(s): GLUCAP in the last 168 hours. Urine analysis:    Component Value Date/Time   COLORURINE YELLOW 05/14/2020 [place]  05/14/2020 1049   LABSPEC 1.025 05/14/2020 1049   PHURINE 6.0 05/14/2020 1049   GLUCOSEU NEGATIVE 05/14/2020 1049   HGBUR NEGATIVE 05/14/2020 1049   BILIRUBINUR NEGATIVE 05/14/2020 1049   KETONESUR 15 (A) 05/14/2020 1049   PROTEINUR TRACE (A) 05/14/2020 1049   UROBILINOGEN 0.2 12/02/2012 1725   NITRITE NEGATIVE 05/14/2020 1049   LEUKOCYTESUR NEGATIVE 05/14/2020 1049   Sepsis Labs: @LABRCNTIP (procalcitonin:4,lacticidven:4) ) Recent Results (from the past 240 hour(s))  SARS Coronavirus 2 by RT PCR (hospital order, performed in [place] hospital lab) Nasopharyngeal Nasopharyngeal Swab     Status: None   Collection Time: 05/14/20 11:19 AM   Specimen: Nasopharyngeal Swab  Result Value Ref Range Status   SARS Coronavirus 2 NEGATIVE NEGATIVE Final    Comment: (NOTE) SARS-CoV-2 target nucleic acids are NOT DETECTED.  The SARS-CoV-2 RNA is generally detectable in upper and lower respiratory specimens during the acute phase of infection. The lowest concentration of SARS-CoV-2 viral copies this assay can detect is 250 copies / mL. A negative result does not preclude SARS-CoV-2 infection and should not be used as the sole basis for treatment or other patient management decisions.  A negative result may occur with improper specimen collection / handling, submission of specimen other than nasopharyngeal swab, presence of viral mutation(s) within the areas targeted by this assay, and inadequate number of viral copies (<250 copies / mL). A negative result must be combined with clinical observations, patient history, and epidemiological information.  Fact Sheet for Patients:   StrictlyIdeas.no  Fact Sheet for Healthcare Providers: BankingDealers.co.za  This test is not yet approved or  cleared by the [place] FDA and has been authorized for detection and/or diagnosis of SARS-CoV-2 by FDA under an Emergency Use Authorization (EUA).  This  EUA will remain in effect (meaning this test can be used) for the duration of the COVID-19 declaration under Section 564(b)(1) of the Act, 21 U.S.C. section 360bbb-3(b)(1), unless the authorization is terminated or revoked sooner.  Performed at [facility], [address] [place], [place] [address]      Radiological Exams on Admission: DG Chest [facility] 1 View  Result Date: 05/14/2020 CLINICAL DATA:  Crack cocaine overdose EXAM: PORTABLE CHEST 1 VIEW COMPARISON:  Portable exam 1118 hours compared to 03/13/2019 FINDINGS: Normal heart size, mediastinal contours, and pulmonary vascularity. Peribronchial thickening without pulmonary infiltrate, pleural effusion, or pneumothorax. Osseous structures unremarkable. IMPRESSION: Bronchitic changes without infiltrate. Electronically Signed   By: Elta Guadeloupe  Thornton Papas M.D.   On: 05/14/2020 11:29    EKG: Independently reviewed. Sinus tach, nonspecific STT changes    Time spent:60 minutes Code Status:   FULL Family Communication:  No Family at bedside Disposition Plan: expect 2-3 day hospitalization Consults called: none DVT Prophylaxis: [place] Lovenox  Orson Eva, DO  [facility] Pager [phone]  If 7PM-7AM, please contact night-coverage [email or web address] Password [facility] 05/14/2020, 1:34 PM

## 2020-05-14 NOTE — ED Notes (Signed)
Patient's personal belongings:  2 phones; 1 pair of glasses; and a pair of socks are in psych locker.

## 2020-05-14 NOTE — Progress Notes (Signed)
Pharmacy Antibiotic Note  Rebecca Delacruz is a 34 y.o. female admitted on 05/14/2020 with sepsis.  Pharmacy has been consulted for Vancomycin and Cefepime dosing.  Plan: Vancomycin 1000 mg IV every 24 hours. Expected AUC 450.  Cefepime 2000 mg IV every 8 hours. Monitor labs, c/s, and vanco level as indicated.  Height: 5\' 2"  (157.5 cm) Weight: 54 kg (119 lb) IBW/kg (Calculated) : 50.1  Temp (24hrs), Avg:102.2 F (39 C), Min:102.2 F (39 C), Max:102.2 F (39 C)  Recent Labs  Lab 05/14/20 1107  WBC 7.2  CREATININE 0.63  LATICACIDVEN 3.2*    Estimated Creatinine Clearance: 79.8 mL/min (by C-G formula based on SCr of 0.63 mg/dL).    Allergies  Allergen Reactions  . Codeine Hives and Nausea Only  . Aspirin Nausea Only and Palpitations    Antimicrobials this admission: Vanco 8/12 >>  Cefepime 8/12 >>    Microbiology results: 8/12 BCx: pending   Thank you for allowing pharmacy to be a part of this patient's care.  Margot Ables, PharmD Clinical Pharmacist 05/14/2020 4:12 PM

## 2020-05-14 NOTE — ED Provider Notes (Signed)
[facility] Emergency Department Provider Note MRN:  [id number]  Arrival date & time: 05/14/20     Chief Complaint   Overdose History of Present Illness   Rebecca Delacruz is a 34 y.o. year-old female with a history of chronic pain presenting to the ED with chief complaint of overdose.  Patient arriving in police custody, reportedly she admits to doing drugs shortly prior to arrival.  Police were called because she was running naked throughout the neighborhood.  She seems to have spit out some illicit drugs from her mouth while in triage.  She is altered and moderately combative.  I was unable to obtain an accurate HPI, PMH, or ROS due to the patient's altered mental status.  Level 5 caveat.  Review of Systems  Positive for altered mental status.  Patient's Health History    Past Medical History:  Diagnosis Date  . Asthma   . Back pain   . Cervical pain   . Chronic bilateral low back pain with sciatica   . Constipation   . Degenerative lumbar disc   . Gonorrhea   . No pertinent past medical history   . Ovarian cyst   . Scoliosis   . Shortness of breath   . Thyroid disease    overactive  . Trichomonas     Past Surgical History:  Procedure Laterality Date  . NO PAST SURGERIES    . TUBAL LIGATION  10/15/2011   Procedure: POST PARTUM TUBAL LIGATION;  Surgeon: Jonnie Kind, MD;  Location: [facility];  Service: Gynecology;  Laterality: Bilateral;  Post partum tubal ligation bilateral with filshie clips    Family History  Problem Relation Age of Onset  . Diabetes Mother   . Hypertension Mother   . Asthma Mother   . Diabetes Maternal Grandmother   . Heart attack Maternal Grandmother   . Hypertension Brother   . Asthma Brother   . Heart attack Maternal Grandfather   . Anesthesia problems Neg Hx   . Hypotension Neg Hx   . Malignant hyperthermia Neg Hx   . Pseudochol deficiency Neg Hx     Social History   Socioeconomic History  . Marital  status: Single    Spouse name: Not on file  . Number of children: 3  . Years of education: Not on file  . Highest education level: Not on file  Occupational History  . Not on file  Tobacco Use  . Smoking status: Current Every Day Smoker    Packs/day: 0.50    Years: 10.00    Pack years: 5.00    Types: Cigarettes  . Smokeless tobacco: Never Used  Vaping Use  . Vaping Use: Never used  Substance and Sexual Activity  . Alcohol use: Not Currently    Alcohol/week: 0.0 standard drinks  . Drug use: No    Types: Marijuana    Comment: 09/09/12  last use around first of nov  . Sexual activity: Yes    Birth control/protection: Surgical    Comment: tubal  Other Topics Concern  . Not on file  Social History Narrative   ** Merged History Encounter **       Social Determinants of Health   Financial Resource Strain:   . Difficulty of Paying Living Expenses:   Food Insecurity:   . Worried About Charity fundraiser in the Last Year:   . [place] in the Last Year:   Transportation Needs:   . Lack of  Transportation (Medical):   Marland Kitchen Lack of Transportation (Non-Medical):   Physical Activity:   . Days of Exercise per Week:   . Minutes of Exercise per Session:   Stress:   . Feeling of Stress :   Social Connections:   . Frequency of Communication with Friends and Family:   . Frequency of Social Gatherings with Friends and Family:   . Attends Religious Services:   . Active Member of Clubs or Organizations:   . Attends Archivist Meetings:   Marland Kitchen Marital Status:   Intimate Partner Violence:   . Fear of Current or Ex-Partner:   . Emotionally Abused:   Marland Kitchen Physically Abused:   . Sexually Abused:      Physical Exam   Vitals:   05/14/20 1034  BP: (!) 149/115  Pulse: (!) 229  Resp: (!) 24  Temp: (!) 102.2 F (39 C)  SpO2: 95%    CONSTITUTIONAL: Ill-appearing, anxious, diaphoretic, agitated NEURO: Oriented to name but distracted, repeating demands and phrases, moves all  extremities EYES:  eyes equal and reactive ENT/NECK:  no LAD, no JVD CARDIO: Tachycardic rate, well-perfused, normal S1 and S2 PULM:  CTAB no wheezing or rhonchi GI/GU:  normal bowel sounds, non-distended, non-tender MSK/SPINE:  No gross deformities, no edema SKIN: Abrasion to left wrist PSYCH: Agitated speech and behavior  *Additional and/or pertinent findings included in MDM below  Diagnostic and Interventional Summary    EKG Interpretation  Date/Time:  Thursday May 14 2020 10:58:55 EDT Ventricular Rate:  149 PR Interval:    QRS Duration: 85 QT Interval:  391 QTC Calculation: 616 R Axis:   56 Text Interpretation: Sinus tachycardia LAE, consider biatrial enlargement ST elevation suggests acute pericarditis Prolonged QT interval Confirmed by Gerlene Fee [phone]) on 05/14/2020 12:42:06 PM      Labs Reviewed  COMPREHENSIVE METABOLIC PANEL - Abnormal; Notable for the following components:      Result Value   Potassium 3.3 (*)    CO2 20 (*)    All other components within normal limits  LACTIC ACID, PLASMA - Abnormal; Notable for the following components:   Lactic Acid, Venous 3.2 (*)    All other components within normal limits  URINALYSIS, ROUTINE W REFLEX MICROSCOPIC - Abnormal; Notable for the following components:   Ketones, ur 15 (*)    Protein, ur TRACE (*)    All other components within normal limits  RAPID URINE DRUG SCREEN, HOSP PERFORMED - Abnormal; Notable for the following components:   Cocaine POSITIVE (*)    Benzodiazepines POSITIVE (*)    All other components within normal limits  ACETAMINOPHEN LEVEL - Abnormal; Notable for the following components:   Acetaminophen (Tylenol), Serum <10 (*)    All other components within normal limits  SALICYLATE LEVEL - Abnormal; Notable for the following components:   Salicylate Lvl <4.6 (*)    All other components within normal limits  CK - Abnormal; Notable for the following components:   Total CK 293 (*)    All  other components within normal limits  TSH - Abnormal; Notable for the following components:   TSH <0.010 (*)    All other components within normal limits  SARS CORONAVIRUS 2 BY RT PCR (HOSPITAL ORDER, [facility])  CULTURE, BLOOD (ROUTINE X 2)  CULTURE, BLOOD (ROUTINE X 2)  CBC  ETHANOL  HCG, SERUM, QUALITATIVE  URINALYSIS, MICROSCOPIC (REFLEX)  PROTIME-INR  APTT  T3, FREE  T4, FREE  PROCALCITONIN  TROPONIN I (  HIGH SENSITIVITY)  TROPONIN I (HIGH SENSITIVITY)    DG Chest Port 1 View  Final Result      Medications  lactated ringers bolus 1,000 mL (has no administration in time range)  0.9 % NaCl with KCl 20 mEq/ L  infusion (has no administration in time range)  ceFEPIme (MAXIPIME) 2 g in sodium chloride 0.9 % 100 mL IVPB (has no administration in time range)  potassium iodide (SSKI) 1 GM/ML solution 200 mg (has no administration in time range)  hydrocortisone sodium succinate (SOLU-CORTEF) 100 MG injection 100 mg (has no administration in time range)  propylthiouracil (PTU) tablet 200 mg (has no administration in time range)  propranolol (INDERAL) tablet 10 mg (has no administration in time range)  midazolam (VERSED) 5 MG/5ML injection 5 mg (5 mg Intravenous Given 05/14/20 1045)  sodium chloride 0.9 % bolus 1,000 mL (1,000 mLs Intravenous New Bag/Given 05/14/20 1114)  sodium chloride 0.9 % bolus 1,000 mL (0 mLs Intravenous Stopped 05/14/20 1238)  magnesium sulfate IVPB 2 g 50 mL (0 g Intravenous Stopped 05/14/20 1238)     Procedures  /  Critical Care .Critical Care Performed by: Maudie Flakes, MD Authorized by: Maudie Flakes, MD   Critical care provider statement:    Critical care time (minutes):  45   Critical care was necessary to treat or prevent imminent or life-threatening deterioration of the following conditions:  Toxidrome   Critical care was time spent personally by me on the following activities:  Discussions with consultants,  evaluation of patient's response to treatment, examination of patient, ordering and performing treatments and interventions, ordering and review of laboratory studies, ordering and review of radiographic studies, pulse oximetry, re-evaluation of patient's condition, obtaining history from patient or surrogate and review of old charts    ED Course and Medical Decision Making  I have reviewed the triage vital signs, the nursing notes, and pertinent available records from the EMR.  Listed above are laboratory and imaging tests that I personally ordered, reviewed, and interpreted and then considered in my medical decision making (see below for details).  Sympathomimetic toxidrome suspected, patient is hypothermic, tachycardic, hypertensive, clearly agitated on exam.  Patient also has exophthalmos, unclear if acute or chronic, will also be testing TSH.  Patient is significantly agitated requiring benzodiazepine administration intramuscularly so that we can care for her.     Patient was able to rest after 10 mg intramuscular midazolam.  EKG revealing significantly prolonged QT, given IV magnesium.  Heart rate is improving to the 130s after the benzodiazepine administration, providing IV fluids.  Labs reveal significantly decreased TSH, and with the exophthalmos there is some concern for concomitant thyroid storm.  Awaiting free T3 and T4, admitted to hospital service for further care.  Barth Kirks. Sedonia Small, [place] [email or web address]  Final Clinical Impressions(s) / ED Diagnoses     ICD-10-CM   1. Overdose of sympathomimetic agent, accidental or unintentional, initial encounter  T44.901A   2. Exophthalmos  H05.20   3. Prolonged Q-T interval on ECG  R94.31     ED Discharge Orders    None       Discharge Instructions Discussed with and Provided to Patient:   Discharge Instructions   None       Maudie Flakes, MD 05/14/20 1419

## 2020-05-14 NOTE — ED Triage Notes (Addendum)
Pt brought to ED via [facility]. Pt was found by [employer] naked fighting off duty Garment/textile technologist at [employer] in [place]. Pt yelling in triage and having repetitive speech. Pt said she did a dime PTA. Pt spit, what she states is a crack rock out of her mouth in triage, [employer] at bedside.

## 2020-05-14 NOTE — ED Notes (Signed)
Pt was given her belongings back.   [name] NT

## 2020-05-15 LAB — BASIC METABOLIC PANEL
Anion gap: 6 (ref 5–15)
BUN: 10 mg/dL (ref 6–20)
CO2: 21 mmol/L — ABNORMAL LOW (ref 22–32)
Calcium: 8.6 mg/dL — ABNORMAL LOW (ref 8.9–10.3)
Chloride: 111 mmol/L (ref 98–111)
Creatinine, Ser: 0.35 mg/dL — ABNORMAL LOW (ref 0.44–1.00)
GFR calc Af Amer: >60 mL/min (ref >60)
GFR calc non Af Amer: >60 mL/min (ref >60)
Glucose, Bld: 147 mg/dL — ABNORMAL HIGH (ref 70–99)
Potassium: 3.8 mmol/L (ref 3.5–5.1)
Sodium: 138 mmol/L (ref 135–145)

## 2020-05-15 LAB — HIV ANTIBODY (ROUTINE TESTING W REFLEX): HIV Screen 4th Generation wRfx: NONREACTIVE

## 2020-05-15 LAB — CBC
HCT: 33.4 % — ABNORMAL LOW (ref 36.0–46.0)
Hemoglobin: 11 g/dL — ABNORMAL LOW (ref 12.0–15.0)
MCH: 28.1 pg (ref 26.0–34.0)
MCHC: 32.9 g/dL (ref 30.0–36.0)
MCV: 85.2 fL (ref 80.0–100.0)
Platelets: 128 10*3/uL — ABNORMAL LOW (ref 150–400)
RBC: 3.92 MIL/uL (ref 3.87–5.11)
RDW: 12.7 % (ref 11.5–15.5)
WBC: 3.5 10*3/uL — ABNORMAL LOW (ref 4.0–10.5)
nRBC: 0 % (ref 0.0–0.2)

## 2020-05-15 LAB — T3, FREE: T3, Free: 21.4 pg/mL — ABNORMAL HIGH (ref 2.0–4.4)

## 2020-05-15 LAB — TROPONIN I (HIGH SENSITIVITY): Troponin I (High Sensitivity): 25 ng/L — ABNORMAL HIGH (ref <18)

## 2020-05-15 LAB — T4, FREE: Free T4: 2.85 ng/dL — ABNORMAL HIGH (ref 0.61–1.12)

## 2020-05-15 MED ORDER — AMOXICILLIN-POT CLAVULANATE 875-125 MG PO TABS
1.0000 | ORAL_TABLET | Freq: Two times a day (BID) | ORAL | 0 refills | Status: DC
Start: 2020-05-15 — End: 2020-05-15

## 2020-05-15 MED ORDER — DOXYCYCLINE HYCLATE 50 MG PO CAPS
100.0000 mg | ORAL_CAPSULE | Freq: Two times a day (BID) | ORAL | 0 refills | Status: AC
Start: 2020-05-15 — End: 2020-05-20

## 2020-05-15 MED ORDER — CARVEDILOL 6.25 MG PO TABS
6.2500 mg | ORAL_TABLET | Freq: Two times a day (BID) | ORAL | 1 refills | Status: DC
Start: 2020-05-15 — End: 2021-05-13

## 2020-05-15 MED ORDER — LEVOFLOXACIN 750 MG PO TABS
750.0000 mg | ORAL_TABLET | Freq: Every day | ORAL | 0 refills | Status: AC
Start: 2020-05-15 — End: 2020-05-20

## 2020-05-15 MED ORDER — GABAPENTIN 100 MG PO CAPS: 100.0000 mg | ORAL_CAPSULE | Freq: Three times a day (TID) | ORAL | 1 refills | Status: AC

## 2020-05-15 NOTE — Discharge Summary (Signed)
Physician Discharge Summary  Patient ID: Rebecca Delacruz MRN: [id number] DOB/AGE: 05/15/1986 34 y.o.  Admit date: 05/14/2020 Discharge date: 05/15/2020  Admission Diagnoses:  Discharge Diagnoses:  Active Problems:   Graves disease   SIRS (systemic inflammatory response syndrome) (HCC)   Thyroid storm   Cocaine abuse [facility])   Discharged Condition: stable  Hospital Course: Patient is a 34 year old female past medical history significant for thyroid disease, cocaine abuse, chronic pain syndrome and asthma.  Patient presented with confusion and agitation.  Apparently, patient arrived in the emergency department under police custody.  Patient stated that she had been smoking some crack cocaine prior to admission.  Patient was noted to be combative and confused.  On presentation to the hospital, patient was febrile up to 102.2 F, but was hemodynamically stable.  Heart rate was in the 150 range.  BMP and LFTs were unremarkable.  WBC 7.2, hemoglobin 13.2, platelets 105,000.  Urine drug screen positive for cocaine and benzodiazepines.  TSH was <0.05 and T4 was 4.35.  Chest x-ray showed chronic bronchitic markings.  Lactic acid peaked at 3.2.  Troponin was minimally elevated  SIRS -Presented with fever, tachycardia, and tachypnea -Urine was nonrevealing. -Chest x-ray only revealed bronchitic changes. -Procalcitonin was normal. -Lactic acid was elevated. -Patient was admitted for further assessment and management -Patient was pancultured. -Patient was started on IV vancomycin and cefepime. -Patient was volume resuscitated. -Antithyroid medications were resumed. -Free T4 improved to 2.85 today.   -Tachycardia has resolved. -Fever has resolved -There was concern for possible thyroid storm as well. -Patient has been counseled to quit cocaine use. -Patient has improved significantly overnight, and back to baseline.  Patient be discharged back home to the care of the primary care  provider. -Patient will need to follow-up with endocrinologist on discharge. -Patient has been encouraged to also comply with antithyroid medication.    Graves' disease/hyperthyroidism -Concerned about thyroid storm -discussed with pt's endocrinologist,Dr. Dorris Fetch -Patient was started on SSKI 5 drops q 6 hours, hydrocortisone IV and propranolol -Free T4 was 4.35 on presentation, but improved to 2.5.   -TSH <0.05  Cocaine abuse -Patient has been counseled to quit illicit drug use.    Consults: None  Significant Diagnostic Studies: labs: Elevated free T4  Discharge Exam: Blood pressure 135/79, pulse 88, temperature 98.5 F (36.9 C), temperature source Oral, resp. rate 16, height 5\' 2"  (1.575 m), weight 54 kg, SpO2 97 %.  Disposition: Discharge disposition: 01-Home or Self Care   Discharge Instructions    Diet - low sodium heart healthy   Complete by: As directed    Increase activity slowly   Complete by: As directed      Allergies as of 05/15/2020      Reactions   Codeine Hives, Nausea Only   Aspirin Nausea Only, Palpitations      Medication List    STOP taking these medications   atenolol 50 MG tablet Commonly known as: TENORMIN   ondansetron 4 MG tablet Commonly known as: ZOFRAN   promethazine 12.5 MG tablet Commonly known as: PHENERGAN     TAKE these medications   albuterol 1.25 MG/3ML nebulizer solution Commonly known as: ACCUNEB Take 1 ampule by nebulization as needed.   ALPRAZolam 0.5 MG tablet Commonly known as: XANAX SMARTSIG:1-2.5 Tablet(s) By Mouth Twice Daily PRN   carvedilol 6.25 MG tablet Commonly known as: Coreg Take 1 tablet (6.25 mg total) by mouth 2 (two) times daily.   doxycycline 50 MG capsule Commonly known as: VIBRAMYCIN Take  2 capsules (100 mg total) by mouth 2 (two) times daily for 5 days.   gabapentin 100 MG capsule Commonly known as: NEURONTIN Take 1 capsule (100 mg total) by mouth 3 (three) times daily. What changed:    medication strength  See the new instructions.   levofloxacin 750 MG tablet Commonly known as: Levaquin Take 1 tablet (750 mg total) by mouth daily for 5 days.   methimazole 10 MG tablet Commonly known as: Tapazole Take 1 tablet (10 mg total) by mouth 2 (two) times daily.   montelukast 10 MG tablet Commonly known as: SINGULAIR Take 10 mg by mouth daily.   oxyCODONE-acetaminophen 10-325 MG tablet Commonly known as: PERCOCET Take 1 tablet by mouth every 6 (six) hours.   pantoprazole 40 MG tablet Commonly known as: PROTONIX Take 40 mg by mouth daily.   Symbicort 80-4.5 MCG/ACT inhaler Generic drug: budesonide-formoterol TAKE 2 PUFFS BY MOUTH EVERY DAY   Viibryd 20 MG Tabs Generic drug: Vilazodone HCl Take 1 tablet by mouth daily.        SignedBonnell Public 05/15/2020, 6:52 PM

## 2020-05-15 NOTE — Progress Notes (Signed)
**Note De-identified Rebecca Delacruz Obfuscation** EKG completed and placed in patient chart 

## 2020-05-15 NOTE — TOC Initial Note (Signed)
Transition of Care [facility]) - Initial/Assessment Note   Patient Details  Name: Rebecca Delacruz MRN: [id number] Date of Birth: [birth year]  Transition of Care [facility]) CM/SW Contact:    Sherie Don, LCSW Phone Number: 05/15/2020, 2:25 PM  Clinical Narrative: Patient is a 34 year old female who was admitted for SIRS, Graves disease, thryoid storm, and cocaine abuse. At time of admission, patient tested positive for cocaine and benzodiazepines. TOC notified patient was requesting substance use resources. CSW met with patient and provided her with outpatient and residential treatment resources. TOC signing off.  Expected Discharge Plan: Home/Self Care Barriers to Discharge: Active Substance Use - Placement, Continued Medical Work up  Patient Goals and CMS Choice CMS Medicare.gov Compare Post Acute Care list provided to:: Patient Choice offered to / list presented to : Patient  Expected Discharge Plan and Services Expected Discharge Plan: Home/Self Care In-house Referral: Clinical Social Work Discharge Planning Services: NA Post Acute Care Choice: NA              DME Arranged: N/A DME Agency: NA HH Arranged: NA HH Agency: NA  Prior Living Arrangements/Services Lives with:: Self Patient language and need for interpreter reviewed:: Yes Do you feel safe going back to the place where you live?: Yes      Need for Family Participation in Patient Care: Yes (Comment) Care giver support system in place?: Yes (comment) Criminal Activity/Legal Involvement Pertinent to Current Situation/Hospitalization: No - Comment as needed  Activities of Daily Living Home Assistive Devices/Equipment: Nebulizer, Eyeglasses ADL Screening (condition at time of admission) Patient's cognitive ability adequate to safely complete daily activities?: Yes Is the patient deaf or have difficulty hearing?: No Does the patient have difficulty seeing, even when wearing glasses/contacts?: No Does the patient have difficulty  concentrating, remembering, or making decisions?: No Patient able to express need for assistance with ADLs?: Yes Does the patient have difficulty dressing or bathing?: No Independently performs ADLs?: Yes (appropriate for developmental age) Does the patient have difficulty walking or climbing stairs?: No Weakness of Legs: None Weakness of Arms/Hands: None  Emotional Assessment Appearance:: Appears stated age Attitude/Demeanor/Rapport: Engaged Affect (typically observed): Accepting Orientation: : Oriented to Self, Oriented to Place, Oriented to  Time, Oriented to Situation Alcohol / Substance Use: Illicit Drugs Psych Involvement: No (comment)  Admission diagnosis:  Exophthalmos [H05.20] Prolonged Q-T interval on ECG [R94.31] SIRS (systemic inflammatory response syndrome) (HCC) [R65.10] Overdose of sympathomimetic agent, accidental or unintentional, initial encounter [T44.901A] Patient Active Problem List   Diagnosis Date Noted  . SIRS (systemic inflammatory response syndrome) ([place]) 05/14/2020  . Thyroid storm 05/14/2020  . Cocaine abuse ([place]) 05/14/2020  . Personal history of noncompliance with medical treatment, presenting hazards to health 08/26/2019  . Trichomonas infection 03/27/2019  . Graves disease 02/04/2019  . Hyperthyroidism 02/04/2019  . Tobacco abuse 02/04/2019  . Asthma 12/26/2016  . STD (female) 04/20/2016  . Obesity (BMI 30-39.9) 04/15/2016  . 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 Scoliosis 06/10/2013  . Sterilization, postpartum 10/15/2011   PCP:  Adaline Sill, NP Pharmacy[facility]   [id number] [place] [place]Phone: [phone]: [phone] Readmission Risk Interventions No flowsheet data found.

## 2020-05-16 LAB — T3, FREE: T3, Free: 6.3 pg/mL — ABNORMAL HIGH (ref 2.0–4.4)

## 2020-05-20 LAB — CULTURE, BLOOD (ROUTINE X 2)
Culture: NO GROWTH
Culture: NO GROWTH
Special Requests: ADEQUATE

## 2020-06-02 ENCOUNTER — Ambulatory Visit: Payer: Medicaid Other | Admitting: Obstetrics & Gynecology

## 2020-06-05 ENCOUNTER — Encounter: Payer: Self-pay | Admitting: Obstetrics & Gynecology

## 2020-06-05 ENCOUNTER — Other Ambulatory Visit: Payer: Self-pay

## 2020-06-05 ENCOUNTER — Ambulatory Visit (INDEPENDENT_AMBULATORY_CARE_PROVIDER_SITE_OTHER): Payer: Medicaid Other | Admitting: Obstetrics & Gynecology

## 2020-06-05 VITALS — BP 113/65 | HR 104 | Ht 63.0 in | Wt 123.0 lb

## 2020-06-05 DIAGNOSIS — N921 Excessive and frequent menstruation with irregular cycle: Secondary | ICD-10-CM

## 2020-06-05 DIAGNOSIS — N814 Uterovaginal prolapse, unspecified: Secondary | ICD-10-CM | POA: Diagnosis not present

## 2020-06-05 MED ORDER — MEGESTROL ACETATE 40 MG PO TABS
ORAL_TABLET | ORAL | 3 refills | Status: AC
Start: 2020-06-05 — End: ?

## 2020-06-05 NOTE — Progress Notes (Signed)
Chief Complaint  Patient presents with  . Pre-op Exam    discuss surgery    Blood pressure 113/65, pulse (!) 104, height 5\' 3"  (1.6 m), weight 123 lb (55.8 kg).  Rebecca Delacruz presents today for routine follow up related to her pessary.   She is git today for a Miex ring with support #5 She reports no vaginal discharge or vaginal bleeding.  Exam reveals no undue vaginal mucosal pressure of breakdown, no discharge and little vaginal bleeding.  The pessary is removed, cleaned and replaced without difficulty.      ICD-10-CM   1. Uterine prolapse, Grade 2  N81.4    [name] ring with support #5, fit today  2. Menometrorrhagia  N92.1    [name]  management     ADRIEN DIETZMAN will be sen back in 1 months for continued follow up.  Florian Buff, MD  10/04/2020 2:58 PM

## 2020-07-06 ENCOUNTER — Ambulatory Visit: Payer: Medicaid Other | Admitting: Obstetrics & Gynecology

## 2020-08-03 ENCOUNTER — Ambulatory Visit: Payer: Medicaid Other | Admitting: Obstetrics & Gynecology

## 2021-05-12 ENCOUNTER — Emergency Department (HOSPITAL_COMMUNITY)
Admission: EM | Admit: 2021-05-12 | Discharge: 2021-05-13 | Disposition: A | Discharge status: Home / Self Care | Payer: Medicaid Other | Attending: Emergency Medicine | Admitting: Emergency Medicine

## 2021-05-12 ENCOUNTER — Encounter (HOSPITAL_COMMUNITY): Payer: Self-pay | Admitting: Emergency Medicine

## 2021-05-12 ENCOUNTER — Other Ambulatory Visit: Payer: Self-pay

## 2021-05-12 DIAGNOSIS — Z20822 Contact with and (suspected) exposure to covid-19: Secondary | ICD-10-CM | POA: Diagnosis not present

## 2021-05-12 DIAGNOSIS — F17210 Nicotine dependence, cigarettes, uncomplicated: Secondary | ICD-10-CM | POA: Insufficient documentation

## 2021-05-12 DIAGNOSIS — J069 Acute upper respiratory infection, unspecified: Secondary | ICD-10-CM | POA: Insufficient documentation

## 2021-05-12 DIAGNOSIS — R000 Tachycardia, unspecified: Secondary | ICD-10-CM | POA: Insufficient documentation

## 2021-05-12 DIAGNOSIS — J45909 Unspecified asthma, uncomplicated: Secondary | ICD-10-CM | POA: Insufficient documentation

## 2021-05-12 DIAGNOSIS — R059 Cough, unspecified: Secondary | ICD-10-CM | POA: Diagnosis present

## 2021-05-12 DIAGNOSIS — N9489 Other specified conditions associated with female genital organs and menstrual cycle: Secondary | ICD-10-CM | POA: Diagnosis not present

## 2021-05-12 DIAGNOSIS — Z7951 Long term (current) use of inhaled steroids: Secondary | ICD-10-CM | POA: Insufficient documentation

## 2021-05-12 LAB — CBC
HCT: 36.9 % (ref 36.0–46.0)
Hemoglobin: 12.5 g/dL (ref 12.0–15.0)
MCH: 25.7 pg — ABNORMAL LOW (ref 26.0–34.0)
MCHC: 33.9 g/dL (ref 30.0–36.0)
MCV: 75.9 fL — ABNORMAL LOW (ref 80.0–100.0)
Platelets: 233 10*3/uL (ref 150–400)
RBC: 4.86 MIL/uL (ref 3.87–5.11)
RDW: 14.3 % (ref 11.5–15.5)
WBC: 5.8 10*3/uL (ref 4.0–10.5)
nRBC: 0 % (ref 0.0–0.2)

## 2021-05-12 MED ORDER — ONDANSETRON 4 MG PO TBDP
4.0000 mg | ORAL_TABLET | Freq: Once | ORAL | Status: AC | PRN
  Administered 2021-05-13: 4 mg via ORAL
  Filled 2021-05-12: qty 1

## 2021-05-12 NOTE — ED Provider Notes (Signed)
Emergency Medicine Provider Triage Evaluation Note  Rebecca Delacruz , a 35 y.o. female  was evaluated in triage.  Pt complains of persistent dry cough, nausea, and vomiting x2 days.  Patient also endorses myalgias.  She also states she has been having chest pain due to "coughing".  Denies sick contacts and known COVID exposures.  Patient is vaccinated against COVID-19 however, has not received her booster shot.  No fever or chills.  Review of Systems  Positive: CP, cough Negative: fever  Physical Exam  BP (!) 149/86   Pulse (!) 112   Temp 99.6 F (37.6 C) (Oral)   Resp 16   SpO2 98%  Gen:   Awake, no distress   Resp:  Normal effort  MSK:   Moves extremities without difficulty  Other:    Medical Decision Making  Medically screening exam initiated at 11:35 PM.  Appropriate orders placed.  [name] was informed that the remainder of the evaluation will be completed by another provider, this initial triage assessment does not replace that evaluation, and the importance of remaining in the ED until their evaluation is complete.  Suspect viral etiology Labs ordered COVID/influenza   Karie Kirks 05/12/21 2337    Ripley Fraise, MD 05/14/21 [phone]

## 2021-05-12 NOTE — ED Triage Notes (Signed)
Patient presents with a persistent, dry cough, nausea and vomiting for 2 days. She also complains of general pain and chest pain.   EMS vitals: 142 palpated BP 113 HR 20 RR 97% SPO2 on room air 111 CBG 97.7 Temp

## 2021-05-13 ENCOUNTER — Emergency Department (HOSPITAL_COMMUNITY): Payer: Medicaid Other

## 2021-05-13 LAB — URINALYSIS, ROUTINE W REFLEX MICROSCOPIC
Bilirubin Urine: NEGATIVE
Glucose, UA: NEGATIVE mg/dL
Hgb urine dipstick: NEGATIVE
Ketones, ur: 5 mg/dL — AB
Nitrite: NEGATIVE
Protein, ur: NEGATIVE mg/dL
Specific Gravity, Urine: 1.021 (ref 1.005–1.030)
pH: 7 (ref 5.0–8.0)

## 2021-05-13 LAB — COMPREHENSIVE METABOLIC PANEL
ALT: 21 U/L (ref 0–44)
AST: 19 U/L (ref 15–41)
Albumin: 3.5 g/dL (ref 3.5–5.0)
Alkaline Phosphatase: 95 U/L (ref 38–126)
Anion gap: 9 (ref 5–15)
BUN: 7 mg/dL (ref 6–20)
CO2: 26 mmol/L (ref 22–32)
Calcium: 9.1 mg/dL (ref 8.9–10.3)
Chloride: 105 mmol/L (ref 98–111)
Creatinine, Ser: 0.35 mg/dL — ABNORMAL LOW (ref 0.44–1.00)
GFR, Estimated: >60 mL/min (ref >60)
Glucose, Bld: 99 mg/dL (ref 70–99)
Potassium: 3.9 mmol/L (ref 3.5–5.1)
Sodium: 140 mmol/L (ref 135–145)
Total Bilirubin: 0.5 mg/dL (ref 0.3–1.2)
Total Protein: 7.3 g/dL (ref 6.5–8.1)

## 2021-05-13 LAB — RESP PANEL BY RT-PCR (FLU A&B, COVID) ARPGX2
Influenza A by PCR: NEGATIVE
Influenza B by PCR: NEGATIVE
SARS Coronavirus 2 by RT PCR: NEGATIVE

## 2021-05-13 LAB — I-STAT BETA HCG BLOOD, ED (MC, WL, AP ONLY): I-stat hCG, quantitative: <5 m[IU]/mL (ref <5)

## 2021-05-13 LAB — LIPASE, BLOOD: Lipase: 30 U/L (ref 11–51)

## 2021-05-13 MED ORDER — LIDOCAINE VISCOUS HCL 2 % MT SOLN
15.0000 mL | Freq: Once | OROMUCOSAL | Status: AC
  Administered 2021-05-13: 15 mL via ORAL
  Filled 2021-05-13: qty 15

## 2021-05-13 MED ORDER — KETOROLAC TROMETHAMINE 30 MG/ML IJ SOLN
30.0000 mg | Freq: Once | INTRAMUSCULAR | Status: AC
  Administered 2021-05-13: 30 mg via INTRAVENOUS
  Filled 2021-05-13: qty 1

## 2021-05-13 MED ORDER — CARVEDILOL 3.125 MG PO TABS
6.2500 mg | ORAL_TABLET | Freq: Once | ORAL | Status: AC
  Administered 2021-05-13: 6.25 mg via ORAL
  Filled 2021-05-13: qty 2

## 2021-05-13 MED ORDER — CARVEDILOL 6.25 MG PO TABS: 6.2500 mg | ORAL_TABLET | Freq: Two times a day (BID) | ORAL | 0 refills | Status: AC

## 2021-05-13 MED ORDER — SODIUM CHLORIDE 0.9 % IV BOLUS (SEPSIS)
1000.0000 mL | Freq: Once | INTRAVENOUS | Status: AC
  Administered 2021-05-13: 1000 mL via INTRAVENOUS

## 2021-05-13 MED ORDER — METHIMAZOLE 10 MG PO TABS
10.0000 mg | ORAL_TABLET | Freq: Once | ORAL | Status: AC
  Administered 2021-05-13: 10 mg via ORAL
  Filled 2021-05-13: qty 1

## 2021-05-13 MED ORDER — METHIMAZOLE 10 MG PO TABS: 10.0000 mg | ORAL_TABLET | Freq: Two times a day (BID) | ORAL | 0 refills | Status: AC

## 2021-05-13 MED ORDER — ALBUTEROL SULFATE HFA 108 (90 BASE) MCG/ACT IN AERS
2.0000 | INHALATION_SPRAY | Freq: Once | RESPIRATORY_TRACT | Status: AC
  Administered 2021-05-13: 2 via RESPIRATORY_TRACT
  Filled 2021-05-13: qty 6.7

## 2021-05-13 MED ORDER — ALUM & MAG HYDROXIDE-SIMETH 200-200-20 MG/5ML PO SUSP
30.0000 mL | Freq: Once | ORAL | Status: AC
  Administered 2021-05-13: 30 mL via ORAL
  Filled 2021-05-13: qty 30

## 2021-05-13 NOTE — ED Provider Notes (Signed)
[facility] DEPT Provider Note   CSN: [id number] Arrival date & time: 05/12/21  2305     History Chief Complaint  Patient presents with   Cough   Nausea   Emesis   Chest Pain   Generalized Body Aches    Rebecca Delacruz is a 35 y.o. female.  The history is provided by the patient.  Cough Severity:  Moderate Onset quality:  Gradual Duration:  2 days Timing:  Intermittent Progression:  Worsening Chronicity:  New Smoker: yes   Relieved by:  Nothing Worsened by:  Nothing Associated symptoms: chest pain and chills   Emesis Associated symptoms: chills and cough   Chest Pain Associated symptoms: cough and vomiting   Patient with history of hyperthyroid presents with cough.  Patient reports the past 2 days she has had dry cough, sore throat and is had vomiting.  She also reports generalized pain and chest pain with cough.  No recorded fevers.  She is vaccinated for COVID-19.  Patient is a smoker    Past Medical History:  Diagnosis Date   Asthma    Back pain    Cervical pain    Chronic bilateral low back pain with sciatica    Constipation    Degenerative lumbar disc    Gonorrhea    No pertinent past medical history    Ovarian cyst    Scoliosis    Shortness of breath    Thyroid disease    overactive   Trichomonas     Patient Active Problem List   Diagnosis Date Noted   SIRS (systemic inflammatory response syndrome) ([place]) 05/14/2020   Thyroid storm 05/14/2020   Cocaine abuse ([place]) 05/14/2020   Personal history of noncompliance with medical treatment, presenting hazards to health 08/26/2019   Trichomonas infection 03/27/2019   Graves disease 02/04/2019   Hyperthyroidism 02/04/2019   Tobacco abuse 02/04/2019   Asthma 12/26/2016   STD (female) 04/20/2016   Obesity (BMI 30-39.9) 04/15/2016   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   Scoliosis 06/10/2013   Sterilization, postpartum 10/15/2011    Past Surgical History:  Procedure  Laterality Date   NO PAST SURGERIES     TUBAL LIGATION  10/15/2011   Procedure: POST PARTUM TUBAL LIGATION;  Surgeon: Jonnie Kind, MD;  Location: [facility];  Service: Gynecology;  Laterality: Bilateral;  Post partum tubal ligation bilateral with filshie clips     OB History     Gravida  5   Para  4   Term  3   Preterm  1   AB  1   Living  3      SAB      IAB      Ectopic      Multiple      Live Births  1        Obstetric Comments  Pt reported Abruption as part of del of [place], in 2010, but records deny any mention of abruption.  Pt had known FDIU prior to presentation in active labor, delivering promptly upon arrival  Urine Drug screen neg at time of del.         Family History  Problem Relation Age of Onset   Diabetes Mother    Hypertension Mother    Asthma Mother    Diabetes Maternal Grandmother    Heart attack Maternal Grandmother    Hypertension Brother    Asthma Brother    Heart attack Maternal Grandfather    Anesthesia problems Neg Hx  Hypotension Neg Hx    Malignant hyperthermia Neg Hx    Pseudochol deficiency Neg Hx     Social History   Tobacco Use   Smoking status: Every Day    Packs/day: 0.50    Years: 10.00    Pack years: 5.00    Types: Cigarettes   Smokeless tobacco: Never  Vaping Use   Vaping Use: Never used  Substance Use Topics   Alcohol use: Not Currently    Alcohol/week: 0.0 standard drinks   Drug use: No    Types: Marijuana    Comment: 09/09/12  last use around first of nov    Home Medications Prior to Admission medications   Medication Sig Start Date End Date Taking? Authorizing Provider  carvedilol (COREG) 6.25 MG tablet Take 1 tablet (6.25 mg total) by mouth 2 (two) times daily with a meal. 05/13/21  Yes Ripley Fraise, MD  methimazole (TAPAZOLE) 10 MG tablet Take 1 tablet (10 mg total) by mouth 2 (two) times daily. 05/13/21  Yes Ripley Fraise, MD  albuterol (ACCUNEB) 1.25 MG/3ML nebulizer solution Take 1 ampule  by nebulization as needed.     [provider]  ALPRAZolam Duanne Moron) 0.5 MG tablet SMARTSIG:1-2.5 Tablet(s) By Mouth Twice Daily PRN 04/25/20   [provider]  budesonide-formoterol (SYMBICORT) 80-4.5 MCG/ACT inhaler TAKE 2 PUFFS BY MOUTH EVERY DAY 07/30/19   [provider]  gabapentin (NEURONTIN) 100 MG capsule Take 1 capsule (100 mg total) by mouth 3 (three) times daily. 05/15/20   Bonnell Public, MD  megestrol ([name]) 40 MG tablet 3 tablets a day for 5 days, 2 tablets a day for 5 days then 1 tablet daily 06/05/20   Florian Buff, MD  montelukast (SINGULAIR) 10 MG tablet Take 10 mg by mouth daily. 01/29/19   [provider]  oxyCODONE-acetaminophen (PERCOCET) 10-325 MG tablet Take 1 tablet by mouth every 6 (six) hours. 04/21/20   [provider]  pantoprazole (PROTONIX) 40 MG tablet Take 40 mg by mouth daily. 12/28/18   [provider]  VIIBRYD 20 MG TABS Take 1 tablet by mouth daily. 04/20/20   [provider]    Allergies    Codeine and Aspirin  Review of Systems   Review of Systems  Constitutional:  Positive for chills.  Respiratory:  Positive for cough.   Cardiovascular:  Positive for chest pain.  Gastrointestinal:  Positive for vomiting.  All other systems reviewed and are negative.  Physical Exam Updated Vital Signs BP 137/75 (BP Location: Left Arm)   Pulse (!) 125   Temp 97.9 F (36.6 C) (Oral)   Resp (!) 22   LMP  (LMP Unknown)   SpO2 99%   Physical Exam CONSTITUTIONAL: Well developed/well nourished HEAD: Normocephalic/atraumatic EYES: EOMI/PERRL, mild exophthalmos ENMT: Mucous membranes moist NECK: supple no meningeal signs SPINE/BACK:entire spine nontender CV: S1/S2 noted, tachycardic LUNGS: Lungs are clear to auscultation bilaterally, no apparent distress ABDOMEN: soft, nontender, no rebound or guarding, bowel sounds noted throughout abdomen GU:no cva tenderness NEURO: Pt is awake/alert/appropriate,  moves all extremitiesx4.  No facial droop.   EXTREMITIES: pulses normal/equal, full ROM SKIN: warm, color normal PSYCH: no abnormalities of mood noted, alert and oriented to situation  ED Results / Procedures / Treatments   Labs (all labs ordered are listed, but only abnormal results are displayed) Labs Reviewed  COMPREHENSIVE METABOLIC PANEL - Abnormal; Notable for the following components:      Result Value   Creatinine, Ser 0.35 (*)  All other components within normal limits  CBC - Abnormal; Notable for the following components:   MCV 75.9 (*)    MCH 25.7 (*)    All other components within normal limits  URINALYSIS, ROUTINE W REFLEX MICROSCOPIC - Abnormal; Notable for the following components:   APPearance HAZY (*)    Ketones, ur 5 (*)    Leukocytes,Ua TRACE (*)    Bacteria, UA RARE (*)    All other components within normal limits  RESP PANEL BY RT-PCR (FLU A&B, COVID) ARPGX2  LIPASE, BLOOD  I-STAT BETA HCG BLOOD, ED ([facility], [facility], [facility] ONLY)    EKG EKG Interpretation  Date/Time:  Thursday May 13 2021 02:41:03 EDT Ventricular Rate:  117 PR Interval:  161 QRS Duration: 73 QT Interval:  332 QTC Calculation: 464 R Axis:   42 Text Interpretation: Sinus tachycardia ST elev, probable normal early repol pattern Confirmed by Ripley Fraise [phone]) on 05/13/2021 2:52:59 AM  Radiology DG Chest Port 1 View  Result Date: 05/13/2021 CLINICAL DATA:  Cough, COVID EXAM: PORTABLE CHEST 1 VIEW COMPARISON:  Radiograph 05/14/2020 FINDINGS: No consolidation, features of edema, pneumothorax, or effusion. Pulmonary vascularity is normally distributed. The cardiomediastinal contours are unremarkable. No acute osseous or soft tissue abnormality. Remote posttraumatic deformity of the right clavicle is stable from priors. Telemetry leads overlie the chest. IMPRESSION: No acute cardiopulmonary abnormality. Electronically Signed   By: Lovena Le M.D.   On: 05/13/2021 03:42     Procedures Procedures   Medications Ordered in ED Medications  ondansetron (ZOFRAN-ODT) disintegrating tablet 4 mg (4 mg Oral Given 05/13/21 0258)  sodium chloride 0.9 % bolus 1,000 mL (0 mLs Intravenous Stopped 05/13/21 0403)  ketorolac (TORADOL) 30 MG/ML injection 30 mg (30 mg Intravenous Given 05/13/21 0306)  albuterol (VENTOLIN HFA) 108 (90 Base) MCG/ACT inhaler 2 puff (2 puffs Inhalation Given 05/13/21 0403)  alum & mag hydroxide-simeth (MAALOX/MYLANTA) 200-200-20 MG/5ML suspension 30 mL (30 mLs Oral Given 05/13/21 0500)    And  lidocaine (XYLOCAINE) 2 % viscous mouth solution 15 mL (15 mLs Oral Given 05/13/21 0500)  sodium chloride 0.9 % bolus 1,000 mL (1,000 mLs Intravenous New Bag/Given 05/13/21 0537)  methimazole (TAPAZOLE) tablet 10 mg (10 mg Oral Given 05/13/21 [id number])  carvedilol (COREG) tablet 6.25 mg (6.25 mg Oral Given 05/13/21 [id number])    ED Course  I have reviewed the triage vital signs and the nursing notes.  Pertinent labs & imaging results that were available during my care of the patient were reviewed by me and considered in my medical decision making (see chart for details).    MDM Rules/Calculators/A&P                           Patient presented with cough, body aches for the past several days.  COVID-19 testing negative.  Chest x-ray was personally reviewed and is negative Patient reports her sore throat is improving after viscous lidocaine. There is no hypoxia, and her work of breathing is improved Patient was encouraged to stop smoking  Patient was noted to be tachycardic.  Patient reports she has not had any of her blood pressure or thyroid medication since being released from prison 3 months ago She is unsure of all of her meds, but she does note she is on carvedilol and Tapazole. Will provide short course of these medications and also give consult for her to establish new PCP since being released from prison Repeat heart rate was in 117  just prior to  discharge. Patient's clinical picture and history was more consistent with a viral URI, low suspicion for PE or other acute cardiopulmonary emergency at this time Final Clinical Impression(s) / ED Diagnoses Final diagnoses:  Viral URI with cough  Tachycardia    Rx / DC Orders ED Discharge Orders          Ordered    carvedilol (COREG) 6.25 MG tablet  2 times daily with meals        05/13/21 0716    methimazole (TAPAZOLE) 10 MG tablet  2 times daily        05/13/21 0716             Ripley Fraise, MD 05/13/21 0720

## 2021-05-13 NOTE — ED Notes (Signed)
Patient given refreshments

## 2021-05-13 NOTE — Progress Notes (Addendum)
..  Transition of Care [facility]) - Emergency Department Mini Assessment   Patient Details  Name: Rebecca Delacruz MRN: [id number] Date of Birth: 1986/09/04  Transition of Care [facility]) CM/SW Contact:    Illene Regulus, LCSW Phone Number: 05/13/2021, 8:18 AM   Clinical Narrative: CSW attempted to set up PCP appointment with Primary Care at [facility] , no response will try again.   9:22am  Second attempt to contact wellness clinic no response. CSW will continue to try.   ED Mini Assessment:    Barriers to Discharge: No Barriers Identified             Patient Contact and Communications        ,                 Admission diagnosis:  Cough Patient Active Problem List   Diagnosis Date Noted   SIRS (systemic inflammatory response syndrome) ([place]) 05/14/2020   Thyroid storm 05/14/2020   Cocaine abuse ([place]) 05/14/2020   Personal history of noncompliance with medical treatment, presenting hazards to health 08/26/2019   Trichomonas infection 03/27/2019   Graves disease 02/04/2019   Hyperthyroidism 02/04/2019   Tobacco abuse 02/04/2019   Asthma 12/26/2016   STD (female) 04/20/2016   Obesity (BMI 30-39.9) 04/15/2016   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

## 2021-06-08 ENCOUNTER — Inpatient Hospital Stay: Payer: Medicaid Other | Admitting: Family Medicine

## 2021-09-24 ENCOUNTER — Ambulatory Visit: Payer: Medicaid Other | Admitting: Obstetrics & Gynecology

## 2023-02-01 ENCOUNTER — Ambulatory Visit: Payer: Medicaid Other | Admitting: Obstetrics & Gynecology

## 2023-03-09 ENCOUNTER — Ambulatory Visit: Payer: Medicaid Other | Admitting: Advanced Practice Midwife

## 2023-04-12 ENCOUNTER — Ambulatory Visit: Payer: Medicaid Other | Admitting: Advanced Practice Midwife
